# Patient Record
Sex: Female | Born: 1980 | Race: White | Hispanic: No | Marital: Married | Smoking: Never smoker
Health system: Southern US, Community
[De-identification: ages and names within clinical notes are randomized; demographics above are authoritative.]

## PROBLEM LIST (undated history)

## (undated) DIAGNOSIS — Z9889 Other specified postprocedural states: Secondary | ICD-10-CM

## (undated) DIAGNOSIS — D649 Anemia, unspecified: Secondary | ICD-10-CM

## (undated) DIAGNOSIS — F32A Depression, unspecified: Secondary | ICD-10-CM

## (undated) DIAGNOSIS — C801 Malignant (primary) neoplasm, unspecified: Secondary | ICD-10-CM

## (undated) DIAGNOSIS — I10 Essential (primary) hypertension: Secondary | ICD-10-CM

## (undated) DIAGNOSIS — M797 Fibromyalgia: Secondary | ICD-10-CM

## (undated) DIAGNOSIS — E282 Polycystic ovarian syndrome: Secondary | ICD-10-CM

## (undated) DIAGNOSIS — F329 Major depressive disorder, single episode, unspecified: Secondary | ICD-10-CM

## (undated) DIAGNOSIS — K219 Gastro-esophageal reflux disease without esophagitis: Secondary | ICD-10-CM

## (undated) DIAGNOSIS — J189 Pneumonia, unspecified organism: Secondary | ICD-10-CM

## (undated) DIAGNOSIS — F419 Anxiety disorder, unspecified: Secondary | ICD-10-CM

## (undated) DIAGNOSIS — R112 Nausea with vomiting, unspecified: Secondary | ICD-10-CM

## (undated) HISTORY — PX: BACK SURGERY: SHX140

## (undated) HISTORY — DX: Other specified postprocedural states: Z98.890

## (undated) HISTORY — PX: APPENDECTOMY: SHX54

## (undated) HISTORY — PX: TONSILLECTOMY: SUR1361

## (undated) HISTORY — DX: Nausea with vomiting, unspecified: R11.2

## (undated) HISTORY — DX: Polycystic ovarian syndrome: E28.2

## (undated) HISTORY — PX: OTHER SURGICAL HISTORY: SHX169

## (undated) HISTORY — PX: ABDOMINAL HYSTERECTOMY: SHX81

## (undated) HISTORY — PX: FRACTURE SURGERY: SHX138

---

## 2011-04-08 ENCOUNTER — Encounter (HOSPITAL_COMMUNITY): Payer: Self-pay | Admitting: Pharmacy Technician

## 2011-04-09 ENCOUNTER — Other Ambulatory Visit: Payer: Self-pay | Admitting: Orthopedic Surgery

## 2011-04-09 MED ORDER — TRANEXAMIC ACID 100 MG/ML IV SOLN: 1316.0000 mg | Freq: Once | INTRAVENOUS | Status: DC

## 2011-04-09 NOTE — H&P (Signed)
Vicki Yu is an 30 y.o. female.   Chief Complaint: Left knee pain  HPI: Pt states that she has had left knee pain for about 2.5 years. 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 Pt has had 4 previous surgeries on the left knee which have been ineffective at controlling her pain. One arthroscopy, 2 micro fractures and a osteochondral autograft transplantation (OATS) procedure. She still continues to have pain through all there surgeries and set up an appointment with Dr. Charlann Boxer. After discussions and explaining the risks, benefit and expectations of the procedure she wishes to proceed with a uni-lateral knee replacement of the medial aspect.  PMH:  PCOS GERD Seasonal Allergies  PSH: Appendectomy C-section Ankle surgery Left knee surgeries x 4  Social History:  does not have a smoking history on file. She does not have any smokeless tobacco history on file. Her alcohol and drug histories not on file.  Allergies:  Allergies  Allergen Reactions  . Penicillins Other (See Comments)    Pt does not remember    Medications Prior to Admission  Medication Sig Dispense Refill  . cetirizine (ZYRTEC) 10 MG tablet Take 10 mg by mouth daily.        . metFORMIN (GLUCOPHAGE) 850 MG tablet Take 850 mg by mouth 2 (two) times daily with a meal.        . naproxen sodium (ANAPROX) 220 MG tablet Take 220 mg by mouth 2 (two) times daily as needed. Pain         No current facility-administered medications on file as of 04/09/2011.    Review of Systems  Constitutional: Negative.   HENT: Negative.   Eyes: Negative.   Respiratory: Negative.   Cardiovascular: Negative.   Gastrointestinal: Negative.   Genitourinary: Negative.   Musculoskeletal: Positive for joint pain.  Skin: Negative.   Neurological: Negative.   Endo/Heme/Allergies: Negative.   Psychiatric/Behavioral: Negative.    Vitals: BP -124/84, HR - 90, Resp - 16, Ht - 64",  Wt: 87.7 kg  Physical Exam  Constitutional: She appears well-developed and well-nourished.  HENT:  Head: Normocephalic and atraumatic.  Eyes: Conjunctivae and EOM are normal. Pupils are equal, round, and reactive to light.  Neck: Normal range of motion. Neck supple. No JVD present. No tracheal deviation present. No thyromegaly present.  Cardiovascular: Normal rate, regular rhythm and normal heart sounds.   No murmur heard. Respiratory: Effort normal and breath sounds normal. No respiratory distress. She has no wheezes. She has no rales.  GI: Soft. She exhibits no distension. There is no tenderness.  Musculoskeletal: Normal range of motion. She exhibits tenderness.       Left knee: She exhibits normal range of motion, no swelling, no effusion and no ecchymosis. tenderness (tenderness over pre-patellar bursa, and medial aspect of the left knee.) found.     Assessment/Plan Left knee pain, status post injury 2.5 years ago and 4 surgeries.  Pt is scheduled for a unilateral knee replacement on the medial aspect per Dr. Charlann Boxer at [facility] on 04/20/2011. Risks, benefits and expectations were discussed with the patient. Patient understand the risks, benefits and expectations and wishes to proceed with surgery.  [name] Vicki Yu   PAC  04/09/2011, 6:28 PM

## 2011-04-13 ENCOUNTER — Encounter (HOSPITAL_COMMUNITY): Payer: Self-pay

## 2011-04-13 ENCOUNTER — Encounter (HOSPITAL_COMMUNITY): Payer: Worker's Compensation

## 2011-04-13 ENCOUNTER — Other Ambulatory Visit: Payer: Self-pay | Admitting: Orthopedic Surgery

## 2011-04-13 LAB — BASIC METABOLIC PANEL
CO2: 27 mEq/L (ref 19–32)
Calcium: 9.4 mg/dL (ref 8.4–10.5)
Creatinine, Ser: 0.82 mg/dL (ref 0.50–1.10)
Glucose, Bld: 82 mg/dL (ref 70–99)

## 2011-04-13 LAB — DIFFERENTIAL
Basophils Absolute: 0.1 10*3/uL (ref 0.0–0.1)
Basophils Relative: 1 % (ref 0–1)
Eosinophils Absolute: 0.3 10*3/uL (ref 0.0–0.7)
Eosinophils Relative: 4 % (ref 0–5)

## 2011-04-13 LAB — URINALYSIS, ROUTINE W REFLEX MICROSCOPIC
Bilirubin Urine: NEGATIVE
Hgb urine dipstick: NEGATIVE
Specific Gravity, Urine: 1.026 (ref 1.005–1.030)
pH: 5 (ref 5.0–8.0)

## 2011-04-13 LAB — SURGICAL PCR SCREEN
MRSA, PCR: NEGATIVE
Staphylococcus aureus: NEGATIVE

## 2011-04-13 LAB — CBC
MCH: 26.2 pg (ref 26.0–34.0)
MCHC: 32.8 g/dL (ref 30.0–36.0)
MCV: 79.7 fL (ref 78.0–100.0)
Platelets: 357 10*3/uL (ref 150–400)
RDW: 13 % (ref 11.5–15.5)
WBC: 6.9 10*3/uL (ref 4.0–10.5)

## 2011-04-13 LAB — PROTIME-INR: Prothrombin Time: 12.9 seconds (ref 11.6–15.2)

## 2011-04-13 LAB — APTT: aPTT: 32 seconds (ref 24–37)

## 2011-04-13 MED ORDER — CHLORHEXIDINE GLUCONATE 4 % EX LIQD: 60.0000 mL | Freq: Once | CUTANEOUS | Status: AC

## 2011-04-13 NOTE — Pre-Procedure Instructions (Signed)
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

## 2011-04-13 NOTE — Patient Instructions (Signed)
20 Vicki Yu  04/13/2011   Your procedure is scheduled on:  04/20/11  Surgery [phone]  Report to [facility] at 0530 AM.  Call this number if you have problems the morning of surgery: [phone]   Remember:   Do not eat food:After Midnight. Sunday night  Do not drink clear liquids: After Midnight.Sunday night  Take these medicines the morning of surgery with A SIP OF WATER: none   Do not wear jewelry, make-up or nail polish.  Do not wear lotions, powders, or perfumes. You may wear deodorant.  Do not shave 48 hours prior to surgery.  Do not bring valuables to the hospital.  Contacts, dentures or bridgework may not be worn into surgery.  Leave suitcase in the car. After surgery it may be brought to your room.  For patients admitted to the hospital, checkout time is 11:00 AM the day of discharge.   Patients discharged the day of surgery will not be allowed to drive home.  Name and phone number of your driver:husband  Special Instructions: CHG Shower Use Special Wash: 1/2 bottle night before surgery and 1/2 bottle morning of surgery.   Please read over the following fact sheets that you were given: Blood Transfusion Information

## 2011-04-14 ENCOUNTER — Other Ambulatory Visit: Payer: Self-pay | Admitting: Orthopedic Surgery

## 2011-04-20 ENCOUNTER — Encounter (HOSPITAL_COMMUNITY)
Admission: RE | Disposition: A | Discharge status: Home / Self Care | Payer: Self-pay | Source: Ambulatory Visit | Attending: Orthopedic Surgery

## 2011-04-20 ENCOUNTER — Encounter (HOSPITAL_COMMUNITY): Payer: Self-pay | Admitting: Certified Registered Nurse Anesthetist

## 2011-04-20 ENCOUNTER — Encounter (HOSPITAL_COMMUNITY): Payer: Self-pay | Admitting: *Deleted

## 2011-04-20 ENCOUNTER — Inpatient Hospital Stay (HOSPITAL_COMMUNITY): Payer: Worker's Compensation | Admitting: Certified Registered Nurse Anesthetist

## 2011-04-20 ENCOUNTER — Ambulatory Visit (HOSPITAL_COMMUNITY)
Admission: RE | Admit: 2011-04-20 | Discharge: 2011-04-23 | Disposition: A | Discharge status: Home / Self Care | Payer: Worker's Compensation | Source: Ambulatory Visit | Attending: Orthopedic Surgery | Admitting: Orthopedic Surgery

## 2011-04-20 DIAGNOSIS — M25569 Pain in unspecified knee: Secondary | ICD-10-CM | POA: Insufficient documentation

## 2011-04-20 DIAGNOSIS — K219 Gastro-esophageal reflux disease without esophagitis: Secondary | ICD-10-CM | POA: Insufficient documentation

## 2011-04-20 DIAGNOSIS — Z79899 Other long term (current) drug therapy: Secondary | ICD-10-CM | POA: Insufficient documentation

## 2011-04-20 DIAGNOSIS — Z01812 Encounter for preprocedural laboratory examination: Secondary | ICD-10-CM | POA: Insufficient documentation

## 2011-04-20 DIAGNOSIS — M1710 Unilateral primary osteoarthritis, unspecified knee: Principal | ICD-10-CM | POA: Insufficient documentation

## 2011-04-20 DIAGNOSIS — Z96659 Presence of unspecified artificial knee joint: Secondary | ICD-10-CM

## 2011-04-20 DIAGNOSIS — J309 Allergic rhinitis, unspecified: Secondary | ICD-10-CM | POA: Insufficient documentation

## 2011-04-20 DIAGNOSIS — E282 Polycystic ovarian syndrome: Secondary | ICD-10-CM | POA: Insufficient documentation

## 2011-04-20 HISTORY — PX: PARTIAL KNEE ARTHROPLASTY: SHX2174

## 2011-04-20 LAB — SYNOVIAL CELL COUNT + DIFF, W/ CRYSTALS

## 2011-04-20 SURGERY — ARTHROPLASTY, KNEE, UNICOMPARTMENTAL
Anesthesia: Spinal | Site: Knee | Laterality: Left | Wound class: Clean

## 2011-04-20 MED ORDER — CLINDAMYCIN PHOSPHATE 600 MG/50ML IV SOLN
INTRAVENOUS | Status: DC | PRN
  Administered 2011-04-20: 600 mg via INTRAVENOUS

## 2011-04-20 MED ORDER — KETOROLAC TROMETHAMINE 30 MG/ML IJ SOLN
INTRAMUSCULAR | Status: DC | PRN
  Administered 2011-04-20: 30 mg

## 2011-04-20 MED ORDER — DOCUSATE SODIUM 100 MG PO CAPS
100.0000 mg | ORAL_CAPSULE | Freq: Two times a day (BID) | ORAL | Status: DC
  Administered 2011-04-20 – 2011-04-23 (×7): 100 mg via ORAL
  Filled 2011-04-20 (×11): qty 1

## 2011-04-20 MED ORDER — DIPHENHYDRAMINE HCL 50 MG/ML IJ SOLN
INTRAMUSCULAR | Status: DC | PRN
  Administered 2011-04-20 (×2): 25 mg via INTRAVENOUS

## 2011-04-20 MED ORDER — CLINDAMYCIN PHOSPHATE 600 MG/50ML IV SOLN
600.0000 mg | Freq: Four times a day (QID) | INTRAVENOUS | Status: AC
  Administered 2011-04-20 – 2011-04-21 (×3): 600 mg via INTRAVENOUS
  Filled 2011-04-20 (×3): qty 50

## 2011-04-20 MED ORDER — ONDANSETRON HCL 4 MG/2ML IJ SOLN
INTRAMUSCULAR | Status: DC | PRN
  Administered 2011-04-20: 4 mg via INTRAVENOUS

## 2011-04-20 MED ORDER — HYDROMORPHONE HCL PF 2 MG/ML IJ SOLN
0.5000 mg | INTRAMUSCULAR | Status: DC | PRN
  Administered 2011-04-20 – 2011-04-21 (×5): 2 mg via INTRAVENOUS
  Administered 2011-04-21: 0.5 mg via INTRAVENOUS
  Administered 2011-04-21 – 2011-04-22 (×3): 2 mg via INTRAVENOUS
  Filled 2011-04-20 (×9): qty 1

## 2011-04-20 MED ORDER — ACETAMINOPHEN 325 MG PO TABS: 650.0000 mg | ORAL_TABLET | Freq: Four times a day (QID) | ORAL | Status: DC | PRN

## 2011-04-20 MED ORDER — DEXAMETHASONE SODIUM PHOSPHATE 10 MG/ML IJ SOLN
INTRAMUSCULAR | Status: DC | PRN
  Administered 2011-04-20: 10 mg via INTRAVENOUS

## 2011-04-20 MED ORDER — OMEPRAZOLE MAGNESIUM 20 MG PO TBEC: 20.0000 mg | DELAYED_RELEASE_TABLET | Freq: Once | ORAL | Status: DC

## 2011-04-20 MED ORDER — SODIUM CHLORIDE 0.9 % IV SOLN
INTRAVENOUS | Status: DC
  Administered 2011-04-20 – 2011-04-21 (×2): via INTRAVENOUS
  Filled 2011-04-20 (×11): qty 1000

## 2011-04-20 MED ORDER — METHOCARBAMOL 500 MG PO TABS
500.0000 mg | ORAL_TABLET | Freq: Four times a day (QID) | ORAL | Status: DC | PRN
  Administered 2011-04-20 – 2011-04-22 (×6): 500 mg via ORAL
  Filled 2011-04-20 (×6): qty 1

## 2011-04-20 MED ORDER — PROPOFOL 10 MG/ML IV EMUL
INTRAVENOUS | Status: DC | PRN
  Administered 2011-04-20: 75 ug/kg/min via INTRAVENOUS

## 2011-04-20 MED ORDER — SODIUM CHLORIDE 0.9 % IR SOLN
Status: DC | PRN
  Administered 2011-04-20: 1000 mL

## 2011-04-20 MED ORDER — MIDAZOLAM HCL 5 MG/5ML IJ SOLN
INTRAMUSCULAR | Status: DC | PRN
  Administered 2011-04-20 (×2): 0.5 mg via INTRAVENOUS
  Administered 2011-04-20: 1 mg via INTRAVENOUS
  Administered 2011-04-20: 2 mg via INTRAVENOUS

## 2011-04-20 MED ORDER — POLYETHYLENE GLYCOL 3350 17 G PO PACK
17.0000 g | PACK | Freq: Every day | ORAL | Status: DC | PRN
  Filled 2011-04-20: qty 1

## 2011-04-20 MED ORDER — PROMETHAZINE HCL 25 MG/ML IJ SOLN: 6.2500 mg | INTRAMUSCULAR | Status: DC | PRN

## 2011-04-20 MED ORDER — HYDROMORPHONE HCL PF 1 MG/ML IJ SOLN
INTRAMUSCULAR | Status: AC
  Filled 2011-04-20: qty 1

## 2011-04-20 MED ORDER — ACETAMINOPHEN 10 MG/ML IV SOLN
INTRAVENOUS | Status: DC | PRN
  Administered 2011-04-20: 1000 mg via INTRAVENOUS

## 2011-04-20 MED ORDER — LACTATED RINGERS IV SOLN
INTRAVENOUS | Status: DC | PRN
  Administered 2011-04-20 (×3): via INTRAVENOUS

## 2011-04-20 MED ORDER — BISACODYL 10 MG RE SUPP: 10.0000 mg | Freq: Every day | RECTAL | Status: DC | PRN

## 2011-04-20 MED ORDER — MENTHOL 3 MG MT LOZG: 1.0000 | LOZENGE | OROMUCOSAL | Status: DC | PRN

## 2011-04-20 MED ORDER — PANTOPRAZOLE SODIUM 40 MG PO TBEC
40.0000 mg | DELAYED_RELEASE_TABLET | Freq: Once | ORAL | Status: AC
  Administered 2011-04-20: 40 mg via ORAL
  Filled 2011-04-20: qty 1

## 2011-04-20 MED ORDER — METOCLOPRAMIDE HCL 5 MG/ML IJ SOLN
5.0000 mg | Freq: Three times a day (TID) | INTRAMUSCULAR | Status: DC | PRN
Start: 2011-04-20 — End: 2011-04-23

## 2011-04-20 MED ORDER — FLEET ENEMA 7-19 GM/118ML RE ENEM: 1.0000 | ENEMA | Freq: Every day | RECTAL | Status: DC | PRN

## 2011-04-20 MED ORDER — PHENOL 1.4 % MT LIQD: 1.0000 | OROMUCOSAL | Status: DC | PRN

## 2011-04-20 MED ORDER — EPHEDRINE SULFATE 50 MG/ML IJ SOLN
INTRAMUSCULAR | Status: DC | PRN
  Administered 2011-04-20: 10 mg via INTRAVENOUS

## 2011-04-20 MED ORDER — METHOCARBAMOL 100 MG/ML IJ SOLN
500.0000 mg | Freq: Four times a day (QID) | INTRAVENOUS | Status: DC | PRN
  Administered 2011-04-21: 500 mg via INTRAVENOUS
  Filled 2011-04-20: qty 5

## 2011-04-20 MED ORDER — CLINDAMYCIN PHOSPHATE 600 MG/50ML IV SOLN: 600.0000 mg | Freq: Once | INTRAVENOUS | Status: DC

## 2011-04-20 MED ORDER — BUPIVACAINE HCL 0.75 % IJ SOLN
INTRAMUSCULAR | Status: DC | PRN
  Administered 2011-04-20: 1.8 mL

## 2011-04-20 MED ORDER — ACETAMINOPHEN 500 MG PO TABS
1000.0000 mg | ORAL_TABLET | Freq: Three times a day (TID) | ORAL | Status: DC
  Administered 2011-04-20 – 2011-04-23 (×9): 1000 mg via ORAL
  Filled 2011-04-20 (×17): qty 2

## 2011-04-20 MED ORDER — ONDANSETRON HCL 4 MG/2ML IJ SOLN
4.0000 mg | Freq: Four times a day (QID) | INTRAMUSCULAR | Status: DC | PRN
  Administered 2011-04-22: 4 mg via INTRAVENOUS
  Filled 2011-04-20: qty 2

## 2011-04-20 MED ORDER — RIVAROXABAN 10 MG PO TABS
10.0000 mg | ORAL_TABLET | ORAL | Status: DC
  Administered 2011-04-21 – 2011-04-23 (×3): 10 mg via ORAL
  Filled 2011-04-20 (×4): qty 1

## 2011-04-20 MED ORDER — DIPHENHYDRAMINE HCL 25 MG PO CAPS
25.0000 mg | ORAL_CAPSULE | Freq: Four times a day (QID) | ORAL | Status: DC | PRN
  Administered 2011-04-20: 25 mg via ORAL
  Filled 2011-04-20: qty 1

## 2011-04-20 MED ORDER — HYDROMORPHONE HCL PF 1 MG/ML IJ SOLN: 0.2500 mg | INTRAMUSCULAR | Status: DC | PRN

## 2011-04-20 MED ORDER — BISACODYL 5 MG PO TBEC: 10.0000 mg | DELAYED_RELEASE_TABLET | Freq: Every day | ORAL | Status: DC | PRN

## 2011-04-20 MED ORDER — METFORMIN HCL 850 MG PO TABS
850.0000 mg | ORAL_TABLET | Freq: Two times a day (BID) | ORAL | Status: DC
  Administered 2011-04-20 – 2011-04-23 (×5): 850 mg via ORAL
  Filled 2011-04-20 (×9): qty 1

## 2011-04-20 MED ORDER — HYDROCODONE-ACETAMINOPHEN 7.5-325 MG PO TABS
1.0000 | ORAL_TABLET | ORAL | Status: DC
  Filled 2011-04-20: qty 1

## 2011-04-20 MED ORDER — ONDANSETRON HCL 4 MG PO TABS
4.0000 mg | ORAL_TABLET | Freq: Four times a day (QID) | ORAL | Status: DC | PRN
  Administered 2011-04-23: 4 mg via ORAL
  Filled 2011-04-20: qty 1

## 2011-04-20 MED ORDER — LORATADINE 10 MG PO TABS
10.0000 mg | ORAL_TABLET | Freq: Every day | ORAL | Status: DC
  Administered 2011-04-20 – 2011-04-23 (×4): 10 mg via ORAL
  Filled 2011-04-20 (×6): qty 1

## 2011-04-20 MED ORDER — SCOPOLAMINE 1 MG/3DAYS TD PT72
MEDICATED_PATCH | TRANSDERMAL | Status: DC | PRN
  Administered 2011-04-20: 1 via TRANSDERMAL

## 2011-04-20 MED ORDER — HYDROMORPHONE HCL PF 2 MG/ML IJ SOLN
0.5000 mg | INTRAMUSCULAR | Status: DC | PRN
  Administered 2011-04-20 (×2): 1 mg via INTRAVENOUS
  Filled 2011-04-20: qty 1

## 2011-04-20 MED ORDER — DIPHENHYDRAMINE HCL 50 MG/ML IJ SOLN
25.0000 mg | Freq: Four times a day (QID) | INTRAMUSCULAR | Status: DC | PRN
  Administered 2011-04-21: 25 mg via INTRAVENOUS
  Filled 2011-04-20: qty 1

## 2011-04-20 MED ORDER — MAGNESIUM HYDROXIDE 400 MG/5ML PO SUSP: 30.0000 mL | Freq: Two times a day (BID) | ORAL | Status: DC | PRN

## 2011-04-20 MED ORDER — FENTANYL CITRATE 0.05 MG/ML IJ SOLN
INTRAMUSCULAR | Status: DC | PRN
  Administered 2011-04-20: 100 ug via INTRAVENOUS

## 2011-04-20 MED ORDER — BUPIVACAINE-EPINEPHRINE 0.25% -1:200000 IJ SOLN
INTRAMUSCULAR | Status: DC | PRN
  Administered 2011-04-20: 30 mL

## 2011-04-20 MED ORDER — OXYCODONE HCL 5 MG PO TABS
5.0000 mg | ORAL_TABLET | ORAL | Status: DC | PRN
  Administered 2011-04-20 – 2011-04-21 (×3): 10 mg via ORAL
  Administered 2011-04-21 – 2011-04-22 (×3): 15 mg via ORAL
  Filled 2011-04-20: qty 2
  Filled 2011-04-20: qty 3
  Filled 2011-04-20 (×2): qty 2
  Filled 2011-04-20 (×2): qty 3

## 2011-04-20 MED ORDER — ACETAMINOPHEN 650 MG RE SUPP: 650.0000 mg | Freq: Four times a day (QID) | RECTAL | Status: DC | PRN

## 2011-04-20 MED ORDER — METOCLOPRAMIDE HCL 10 MG PO TABS: 5.0000 mg | ORAL_TABLET | Freq: Three times a day (TID) | ORAL | Status: DC | PRN

## 2011-04-20 SURGICAL SUPPLY — 45 items
BAG ZIPLOCK 12X15 (MISCELLANEOUS) ×2 IMPLANT
BANDAGE ELASTIC 6 VELCRO ST LF (GAUZE/BANDAGES/DRESSINGS) ×2 IMPLANT
BANDAGE ESMARK 6X9 LF (GAUZE/BANDAGES/DRESSINGS) ×1 IMPLANT
BLADE SAW RECIPROCATING 77.5 (BLADE) ×2 IMPLANT
BLADE SAW SGTL 13.0X1.19X90.0M (BLADE) ×2 IMPLANT
BNDG ESMARK 6X9 LF (GAUZE/BANDAGES/DRESSINGS) ×2
BOWL SMART MIX CTS (DISPOSABLE) ×4 IMPLANT
CEMENT HV SMART SET (Cement) ×4 IMPLANT
CLOTH BEACON ORANGE TIMEOUT ST (SAFETY) ×2 IMPLANT
COVER SURGICAL LIGHT HANDLE (MISCELLANEOUS) ×6 IMPLANT
CUFF TOURN SGL QUICK 34 (TOURNIQUET CUFF) ×1
CUFF TRNQT CYL 34X4X40X1 (TOURNIQUET CUFF) ×1 IMPLANT
DERMABOND ADVANCED (GAUZE/BANDAGES/DRESSINGS) ×1
DERMABOND ADVANCED .7 DNX12 (GAUZE/BANDAGES/DRESSINGS) ×1 IMPLANT
DRAPE EXTREMITY T 121X128X90 (DRAPE) ×2 IMPLANT
DRAPE POUCH INSTRU U-SHP 10X18 (DRAPES) ×2 IMPLANT
DRSG AQUACEL AG ADV 3.5X 6 (GAUZE/BANDAGES/DRESSINGS) ×2 IMPLANT
DRSG TEGADERM 4X4.75 (GAUZE/BANDAGES/DRESSINGS) ×2 IMPLANT
DURAPREP 26ML APPLICATOR (WOUND CARE) ×2 IMPLANT
ELECT REM PT RETURN 9FT ADLT (ELECTROSURGICAL) ×2
ELECTRODE REM PT RTRN 9FT ADLT (ELECTROSURGICAL) ×1 IMPLANT
EVACUATOR 1/8 PVC DRAIN (DRAIN) ×2 IMPLANT
FACESHIELD LNG OPTICON STERILE (SAFETY) ×8 IMPLANT
GAUZE SPONGE 2X2 8PLY STRL LF (GAUZE/BANDAGES/DRESSINGS) ×1 IMPLANT
GLOVE BIOGEL PI IND STRL 7.5 (GLOVE) ×1 IMPLANT
GLOVE BIOGEL PI IND STRL 8.5 (GLOVE) IMPLANT
GLOVE BIOGEL PI INDICATOR 7.5 (GLOVE) ×1
GLOVE BIOGEL PI INDICATOR 8.5 (GLOVE)
GLOVE ORTHO TXT STRL SZ7.5 (GLOVE) ×4 IMPLANT
GOWN STRL NON-REIN LRG LVL3 (GOWN DISPOSABLE) ×2 IMPLANT
KIT BASIN OR (CUSTOM PROCEDURE TRAY) ×2 IMPLANT
MANIFOLD NEPTUNE II (INSTRUMENTS) ×2 IMPLANT
NDL SAFETY ECLIPSE 18X1.5 (NEEDLE) ×1 IMPLANT
NEEDLE HYPO 18GX1.5 SHARP (NEEDLE) ×1
PACK TOTAL JOINT (CUSTOM PROCEDURE TRAY) ×2 IMPLANT
POSITIONER SURGICAL ARM (MISCELLANEOUS) ×2 IMPLANT
SPONGE GAUZE 2X2 STER 10/PKG (GAUZE/BANDAGES/DRESSINGS) ×1
SUCTION FRAZIER TIP 10 FR DISP (SUCTIONS) ×2 IMPLANT
SUT MNCRL AB 4-0 PS2 18 (SUTURE) ×2 IMPLANT
SUT VIC AB 1 CT1 36 (SUTURE) ×4 IMPLANT
SUT VIC AB 2-0 CT1 27 (SUTURE) ×2
SUT VIC AB 2-0 CT1 TAPERPNT 27 (SUTURE) ×2 IMPLANT
SYR 50ML LL SCALE MARK (SYRINGE) ×2 IMPLANT
TOWEL OR 17X26 10 PK STRL BLUE (TOWEL DISPOSABLE) ×4 IMPLANT
TRAY FOLEY CATH 14FRSI W/METER (CATHETERS) ×2 IMPLANT

## 2011-04-20 NOTE — Op Note (Signed)
NAMEDANAHI, Yu             ACCOUNT NO.:  [id number]  MEDICAL RECORD NO.:  [id number]  LOCATION:  [facility]                         FACILITY:  [facility]  PHYSICIAN:  Madlyn Frankel. Charlann Boxer, M.D.  DATE OF BIRTH:  1980-08-04  DATE OF PROCEDURE:  04/20/2011 DATE OF DISCHARGE:                              OPERATIVE REPORT   PREOPERATIVE DIAGNOSES: 1. Left knee medial compartment osteoarthritis. 2. Left knee patellofemoral osteoarthritis with failed multiple     surgical attempts with pain greater than 2 years.  POSTOPERATIVE DIAGNOSES: 1. Left knee medial compartment osteoarthritis. 2. Left knee patellofemoral osteoarthritis with failed multiple     surgical attempts with pain greater than 2 years.  PROCEDURE: 1. Left medial partial knee replacement utilizing Biomet Oxford knee     system with a small femur, a double A left medial tibia, and a size     3 insert. 2. Left knee patellofemoral replacement utilizing a Biomet, small     throat clear shield, and a 34 patella button.  SURGEON:  Madlyn Frankel. Charlann Boxer, MD  ASSISTANT:  Lanney Gins, PA-C  ANESTHESIA:  Spinal.  SPECIMENS:  I did send joint fluid and analyzed.  It had a slightly cloudy appearance.  Initial Gram stain was negative.  I also tested a urine dipstick and leukocyte esterase was negative after 2 minutes.  I did also send it for cell count, which was pending at the end of the case.  TOURNIQUET TIME:  100 minutes at 250 mmHg.  DRAINS:  One Hemovac.  COMPLICATIONS:  None apparent.  INDICATION FOR PROCEDURE:  Vicki Yu is a 30 year old female with a 2-year history of left knee injury and persistent recurring problems in her left knee.  She has had undergone multiple arthroscopic procedures in addition to osteochondral autograft type procedures.  She had presented to our office with persistent and recurrent pain in the left knee with recommendations from other surgeries for further surgical consideration.  We had a  lengthy discussion about her age, recurrent knee condition both medial and patellofemoral, and then after this discussion reviewing all options and continued observation which at this point failed to provide her satisfactory life quality versus repeat arthroscopic surgery versus total knee replacement versus partial knee replacements as we performed.  We decided to proceed in this fashion due to the potential minimalistic approach, preservation of the ligaments as well as addressing both the medial and patellofemoral compartments.  Risks of infection and DVT were discussed as well as the risk of failure of implant and need to proceed with total knee replacement.  She was ready to take this chance given her age and desired activity level.  PROCEDURE IN DETAIL:  Patient was brought to the operative theater. Once adequate anesthesia, preoperative antibiotics and Ancef administered, she was positioned supine with the left leg in a thigh tourniquet.  The left leg was positioned over the Oxford leg holder to allow for 120 degrees of flexion.  The left lower extremity was then prepped and draped in a sterile fashion.  A time-out was performed identifying the patient, planned procedure, and extremity.  The leg was then exsanguinated, tourniquet elevated to 250 mmHg.  The patient had a paramidline incision,  which was utilized and extended slightly proximal and distal to allow for exposure of the knee.  This midline incision was made followed by soft tissue exposure.  A median arthrotomy was then made.  We entered the knee, immediately we encountered an inflammatory  looking fluid that did not appear to be obviously purulent.  Nonetheless, I did at this point, send cultures for stat Gram stain culture.  In addition, I sent a syringe full to pathology for cell count to evaluate for infection.  In addition, we used urine dipstick test to assess for leukocyte esterase and this was negative at 2  minutes.  Given all these findings, we went ahead and proceeded with the case.  Following soft tissue exposure and debridement of the scar tissue above the knee and limited exposure on the proximal and medial aspect of the tibia, an initial retractor was placed.  Using extramedullary guide, parallel along the anterior cortex parallel to the tibial spine.  It was pinned in position using the appropriate guide for 4-mm cut.  The reciprocating saw was used along the notch followed by the oscillating saw.  The bone was removed and noted to have no significant tibial bone as a predominant problem on the medial femoral condyles.  At this point, the cut surface seemed to be best fit with double A tibial tray, the 3 steel gauge felt better with appropriate tension at this point.  At this point, attention was now directed to the femoral side.  The patient had advanced patellofemoral changes, particularly in the trochlea with a large chondral defect down to bone.  Previous harvest site from her transplant are located on the lateral aspect of the anterior lateral plane above the trochlea as well as the medial aspect of the femoral trochlea laterally.  The femoral canal was opened and drilled first in all and then intramedullary guide placed into the tibia and into the femur.  I then oriented the guide for the posterior cutting block and drill holes in relationship to the proximal tibial cut and the intramedullary guide. The holes were drilled into the medial femoral condyle in the central portion and a guide placed and the posterior cut made off the tip of the medial femur.  Following this resection, I used a 3 Spigot.  The 3 Spigot was in place, impacted, and the distal femur milled.  I removed the remaining bone around this area including the notch and then did a trial reduction. With the trial reduction, I felt it is a small femur and it  looked appropriately fit on the medial femoral  condyle.  This double A tibial tray sit nicely on the proximal tibia.  The 3-mm insert felt good in both 90 degrees of flexion and 20 degrees of flexion.  At this point, final preparation of the femur and tibia were carried out.  The 3 tibial tray was pinned into position and used a reciprocating saw to remove some of the bones from this area.  Then, with the osteotome removed the remaining bone from the keeled location.  The distal femur was then repaired with drill holes.  At this point, I placed a trial of small femur, keeled AA tibial tray, and reassessing the size 3 insert.  At this point, I attended to the patellofemoral replacement.  At this point, the patella was everted.  Soft tissue debridement carried out along the lateral aspect of the femur and measured the patella at 23 mm in depth and resected down to  about 14 and used a 34 patellar button.  Lug holes were drilled and I kept the trials in place to protect the patella during further preparation.  Per protocol, the femoral preparation was carried out with a starting drill.  I then tapped in the intramedullary guide to help set rotation.  The guide Was then pinned in place and the anterior flange cut made. This was flushed over the lateral cortex of the anterior femur without notching.  At this point, I chose to use a small femoral shield using a rasp we removed cartilage and bone down within the trochlear segment to allow the shield to fit appropriate into the distal femur anteriorly.  I went ahead and pinned it in position and did a trial with the patella.  At this point, I felt that the patella tracked through this area.  There was perhaps some infrapatellar scarring that is inhibiting a little bit of the tracking in deep flexion and subsequent released more of the infrapatellar and lateral soft tissues to allow for a smoother transition through the trochlea. There was no exposure through the lateral retinaculum.  Given  all these findings, the final components were opened.  The trial components were removed.  I drilled sclerotic bone in all compartments of the knee if necessary.  Following final debridements and manipulations of the bone and soft tissues for final component positioning, the cement was mixed.  I used one box of cement for the medial side and one for the patellofemoral component.  The cement was mixed for the first component.  The tibial tray was placed first, the kept cement removed, the knee was held at 45 degrees of flexion for a minute and a half and the trial stem was then placed.  After a minute and half, the final femoral component was placed and excessive cement was removed.  I placed a trial insert and had the next cement mixed for the femoral component.  The femoral component was then cemented in the above femur shield and cemented into the intramedullary canal.  The patella was then cemented in place and held with a patellar clamp.  The patellofemoral shield was held anterior with a downward pressure until the cement fully cured. Once the cement had fully cured, excessive cement was removed throughout the knee.  I retrialed and chose a 3 mm insert.  The 3 mm insert was snapped into the medial side of the knee.  The tourniquet had been let down after 100 minutes.  We re-irrigated the knee and injected the medial synovial capsule junction of the knee with 0.25% Marcaine and 1 cc of Toradol, a total of 31 cc.  The medium Hemovac drain was placed deep.  We re-irrigated the knee with normal saline solution.  At this point, I reapproximated extensor mechanism with the knee in flexion. The remaining wound was closed with 2-0 Vicryl and running 4-0 Monocryl. The knee was cleaned, dried, and dressed sterilely using Dermabond and Aquacel dressing.  The drain site was dressed separately.  She was then brought to the recovery room in stable condition, tolerating the procedure  well.     Madlyn Frankel Charlann Boxer, M.D.     MDO/MEDQ  D:  04/20/2011  T:  04/20/2011  Job:  [id number]

## 2011-04-20 NOTE — Anesthesia Procedure Notes (Signed)
Spinal  Start time: 04/20/2011 7:40 AM Staffing Anesthesiologist: Azell Der CRNA/Resident: [place], Amaziah Ghosh C Performed by: resident/CRNA  Spinal Block Patient position: sitting Prep: Betadine and site prepped and draped Patient monitoring: heart rate, continuous pulse ox and blood pressure Approach: midline Location: L3-4 Injection technique: single-shot Needle Needle type: Sprotte  Needle gauge: 24 G Assessment Sensory level: T4 Additional Notes Clear CSF before and after injection, No parasthesia

## 2011-04-20 NOTE — Interval H&P Note (Signed)
History and Physical Interval Note:   04/20/2011   7:33 AM   Vicki Yu  has presented today for surgery, with the diagnosis of left knee medial and patella femoral osteoarthritis  The various methods of treatment have been discussed with the patient and family. After consideration of risks, benefits and other options for treatment, the patient has consented to  Procedure(s): Left knee combine Patello-femoral replacement and medial UNICOMPARTMENTAL KNEE as a surgical intervention .  The patients' history has been reviewed, patient examined, no change in status, stable for surgery.  I have reviewed the patients' chart and labs.  Questions were answered to the patient's satisfaction.     Shelda Pal  MD

## 2011-04-20 NOTE — Transfer of Care (Signed)
Immediate Anesthesia Transfer of Care Note  Patient: Vicki Yu  Procedure(s) Performed:  UNICOMPARTMENTAL KNEE - Left unicompartmental knee arthroplasty/ Left Patella Femoral Replacement  Patient Location: PACU  Anesthesia Type: Spinal  Level of Consciousness: awake and alert   Airway & Oxygen Therapy: Patient Spontanous Breathing  Post-op Assessment: Report given to PACU RN  Post vital signs: Reviewed and stable  Complications: No apparent anesthesia complications

## 2011-04-20 NOTE — H&P (View-Only) (Signed)
Vicki Yu is an 30 y.o. female.   Chief Complaint: Left knee pain  HPI: Pt states that she has had left knee pain for about 2.5 years. 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 Pt has had 4 previous surgeries on the left knee which have been ineffective at controlling her pain. One arthroscopy, 2 micro fractures and a osteochondral autograft transplantation (OATS) procedure. She still continues to have pain through all there surgeries and set up an appointment with Dr. Olin. After discussions and explaining the risks, benefit and expectations of the procedure she wishes to proceed with a uni-lateral knee replacement of the medial aspect.  PMH:  PCOS GERD Seasonal Allergies  PSH: Appendectomy C-section Ankle surgery Left knee surgeries x 4  Social History:  does not have a smoking history on file. She does not have any smokeless tobacco history on file. Her alcohol and drug histories not on file.  Allergies:  Allergies  Allergen Reactions  . Penicillins Other (See Comments)    Pt does not remember    Medications Prior to Admission  Medication Sig Dispense Refill  . cetirizine (ZYRTEC) 10 MG tablet Take 10 mg by mouth daily.        . metFORMIN (GLUCOPHAGE) 850 MG tablet Take 850 mg by mouth 2 (two) times daily with a meal.        . naproxen sodium (ANAPROX) 220 MG tablet Take 220 mg by mouth 2 (two) times daily as needed. Pain         No current facility-administered medications on file as of 04/09/2011.    Review of Systems  Constitutional: Negative.   HENT: Negative.   Eyes: Negative.   Respiratory: Negative.   Cardiovascular: Negative.   Gastrointestinal: Negative.   Genitourinary: Negative.   Musculoskeletal: Positive for joint pain.  Skin: Negative.   Neurological: Negative.   Endo/Heme/Allergies: Negative.   Psychiatric/Behavioral: Negative.    Vitals: BP -124/84, HR - 90, Resp - 16, Ht - 64",  Wt: 87.7 kg  Physical Exam  Constitutional: She appears well-developed and well-nourished.  HENT:  Head: Normocephalic and atraumatic.  Eyes: Conjunctivae and EOM are normal. Pupils are equal, round, and reactive to light.  Neck: Normal range of motion. Neck supple. No JVD present. No tracheal deviation present. No thyromegaly present.  Cardiovascular: Normal rate, regular rhythm and normal heart sounds.   No murmur heard. Respiratory: Effort normal and breath sounds normal. No respiratory distress. She has no wheezes. She has no rales.  GI: Soft. She exhibits no distension. There is no tenderness.  Musculoskeletal: Normal range of motion. She exhibits tenderness.       Left knee: She exhibits normal range of motion, no swelling, no effusion and no ecchymosis. tenderness (tenderness over pre-patellar bursa, and medial aspect of the left knee.) found.     Assessment/Plan Left knee pain, status post injury 2.5 years ago and 4 surgeries.  Pt is scheduled for a unilateral knee replacement on the medial aspect per Dr. Olin at [facility] on 04/20/2011. Risks, benefits and expectations were discussed with the patient. Patient understand the risks, benefits and expectations and wishes to proceed with surgery.  Tammera Engert S. Hampton Cost   PAC  04/09/2011, 6:28 PM  

## 2011-04-20 NOTE — Anesthesia Postprocedure Evaluation (Signed)
  Anesthesia Post-op Note  Patient: Vicki Yu  Procedure(s) Performed:  UNICOMPARTMENTAL KNEE - Left unicompartmental knee arthroplasty/ Left Patella Femoral Replacement  Patient Location: PACU  Anesthesia Type: Spinal; moving both feet  Level of Consciousness: awake and alert   Airway and Oxygen Therapy: Patient Spontanous Breathing  Post-op Pain: mild  Post-op Assessment: Post-op Vital signs reviewed, Patient's Cardiovascular Status Stable, Respiratory Function Stable, Patent Airway and No signs of Nausea or vomiting  Post-op Vital Signs: stable  Complications: No apparent anesthesia complications

## 2011-04-20 NOTE — Anesthesia Preprocedure Evaluation (Addendum)
Anesthesia Evaluation  Patient identified by MRN, date of birth, ID band Patient awake    Reviewed: Allergy & Precautions, H&P , NPO status , Patient's Chart, lab work & pertinent test results  Airway       Dental   Pulmonary neg pulmonary ROS,          Cardiovascular Exercise Tolerance: Good     Neuro/Psych Negative Neurological ROS     GI/Hepatic   Endo/Other    Renal/GU    Polycystic ovary disease    Musculoskeletal   Abdominal   Peds  Hematology   Anesthesia Other Findings   Reproductive/Obstetrics                          Anesthesia Physical Anesthesia Plan  ASA: II  Anesthesia Plan: Spinal   Post-op Pain Management:    Induction:   Airway Management Planned: Simple Face Mask  Additional Equipment:   Intra-op Plan:   Post-operative Plan:   Informed Consent: I have reviewed the patients History and Physical, chart, labs and discussed the procedure including the risks, benefits and alternatives for the proposed anesthesia with the patient or authorized representative who has indicated his/her understanding and acceptance.     Plan Discussed with:   Anesthesia Plan Comments:         Anesthesia Quick Evaluation

## 2011-04-20 NOTE — Brief Op Note (Signed)
04/20/2011  10:36 AM  PATIENT:  Vicki Yu  30 y.o. female  PRE-OPERATIVE DIAGNOSIS:  left knee medial and patella femoral osteoarthritis  POST-OPERATIVE DIAGNOSIS:  left knee medial and patella femoral osteoarthritis  PROCEDURE:  Procedure(s): Left Knee medial compartment partial knee replacement and patello-femoral replacement UNICOMPARTMENTAL KNEE  SURGEON:  Surgeon(s): Shelda Pal  PHYSICIAN ASSISTANT: Lanney Gins, PA-C  ANESTHESIA:   spinal  EBL:  Total I/O In: 2000 [I.V.:2000] Out: 925 [Urine:850; Blood:75]  BLOOD ADMINISTERED:none  DRAINS: (1) Hemovact drain(s) in the left knee with  Suction Open   LOCAL MEDICATIONS USED:  BUPIVICAINE 30CC plus 1cc toradol  SPECIMEN:  Aspirate  DISPOSITION OF SPECIMEN:  micro  COUNTS:  YES  TOURNIQUET:   Total Tourniquet Time Documented: Thigh (Left) - 101 minutes  DICTATION: .Other Dictation: Dictation Number [id number]  PLAN OF CARE: Admit for overnight observation  PATIENT DISPOSITION:  PACU - hemodynamically stable.   Delay start of Pharmacological VTE agent (>24hrs) due to surgical blood loss or risk of bleeding:  {YES/NO/NOT APPLICABLE:20182

## 2011-04-21 LAB — CBC
HCT: 30.1 % — ABNORMAL LOW (ref 36.0–46.0)
Hemoglobin: 9.9 g/dL — ABNORMAL LOW (ref 12.0–15.0)
MCV: 80.1 fL (ref 78.0–100.0)
RBC: 3.76 MIL/uL — ABNORMAL LOW (ref 3.87–5.11)
RDW: 13.4 % (ref 11.5–15.5)
WBC: 13.5 10*3/uL — ABNORMAL HIGH (ref 4.0–10.5)

## 2011-04-21 LAB — BASIC METABOLIC PANEL
BUN: 10 mg/dL (ref 6–23)
CO2: 27 mEq/L (ref 19–32)
Chloride: 103 mEq/L (ref 96–112)
Creatinine, Ser: 1.02 mg/dL (ref 0.50–1.10)
GFR calc Af Amer: 84 mL/min — ABNORMAL LOW (ref >90)
Glucose, Bld: 109 mg/dL — ABNORMAL HIGH (ref 70–99)
Potassium: 4.1 mEq/L (ref 3.5–5.1)

## 2011-04-21 MED ORDER — DIPHENHYDRAMINE HCL 50 MG/ML IJ SOLN
25.0000 mg | Freq: Four times a day (QID) | INTRAMUSCULAR | Status: DC | PRN
  Administered 2011-04-21 (×2): 25 mg via INTRAVENOUS
  Administered 2011-04-21: 50 mg via INTRAVENOUS
  Filled 2011-04-21 (×3): qty 1

## 2011-04-21 MED ORDER — DIPHENHYDRAMINE HCL 25 MG PO CAPS: 25.0000 mg | ORAL_CAPSULE | Freq: Four times a day (QID) | ORAL | Status: DC | PRN

## 2011-04-21 NOTE — Progress Notes (Signed)
Occupational Therapy Evaluation Patient Details Name: Vicki Yu MRN: [id number] DOB: July 09, 1980 Today's Date: 04/21/2011  Problem List: There is no problem list on file for this patient.   Past Medical History:  Past Medical History  Diagnosis Date  . Polycystic ovary disease     LMP 04/03/11  states takes Metformin for this  . Hiatal hernia   . PONV (postoperative nausea and vomiting)     facial flushing with body itching with anesthesia- starts then with nausea   Past Surgical History:  Past Surgical History  Procedure Date  . Cesarean section, right  reconstructive surgeryof ankle, 3 arthroscopies left knee, 1 open knee surgery left   . Tonsillectomy   . Appendectomy   . Fracture surgery     OT Assessment/Plan/Recommendation OT Assessment Clinical Impression Statement: Pt will benefit from OT service in the acute care setting to increase I with ADLs and functional transfers in prep for d/c home with family. OT Recommendation/Assessment: Patient will need skilled OT in the acute care venue OT Problem List: Decreased knowledge of use of DME or AE;Pain OT Therapy Diagnosis : Acute pain OT Plan OT Frequency: Min 2X/week OT Treatment/Interventions: Self-care/ADL training;DME and/or AE instruction;Therapeutic activities;Patient/family education OT Recommendation Follow Up Recommendations: None Equipment Recommended: Toilet rise with handles (per pt request) Individuals Consulted Consulted and Agree with Results and Recommendations: Patient OT Goals Acute Rehab OT Goals OT Goal Formulation: With patient Time For Goal Achievement: 7 days ADL Goals Pt Will Perform Grooming: with modified independence;Standing at sink;Other (comment) (for 3 grooming tasks) ADL Goal: Grooming - Progress: Other (comment) Pt Will Transfer to Toilet: with modified independence;Ambulation;with DME;Raised toilet seat with arms ADL Goal: Toilet Transfer - Progress: Other (comment) Pt Will  Perform Tub/Shower Transfer: Shower transfer;with modified independence;Ambulation;with DME ADL Goal: Tub/Shower Transfer - Progress: Other (comment)  OT Evaluation Precautions/Restrictions  Restrictions Weight Bearing Restrictions: Yes LLE Weight Bearing: Weight bearing as tolerated Prior Functioning Home Living Lives With: Spouse Receives Help From: Family Type of Home: House Home Layout: One level Home Access: Stairs to enter Entrance Stairs-Rails: None Secretary/administrator of Steps: 5 Bathroom Shower/Tub: Health visitor: Standard Home Adaptive Equipment: None Additional Comments: dtr to assist with laundry; husband just had RCR Prior Function Level of Independence: Independent with gait;Needs assistance with gait Vocation: Full time employment 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 ADL ADL Eating/Feeding: Simulated;Independent Where Assessed - Eating/Feeding: Chair Grooming: Simulated;Independent Where Assessed - Grooming: Sitting, chair Upper Body Bathing: Simulated;Independent Where Assessed - Upper Body Bathing: Sitting, chair Lower Body Bathing: Simulated;Modified independent Where Assessed - Lower Body Bathing: Sit to stand from chair Upper Body Dressing: Simulated;Independent Where Assessed - Upper Body Dressing: Sitting, chair Lower Body Dressing: Simulated;Modified independent Where Assessed - Lower Body Dressing: Sit to stand from chair Toilet Transfer: Simulated;Minimal assistance Toilet Transfer Method: Stand pivot Toilet Transfer Equipment: Other (comment) (Simulated toilet transfer from chair to bed.) Toileting - Clothing Manipulation: Simulated;Modified independent Where Assessed - Toileting Clothing Manipulation: Standing Toileting - Hygiene: Simulated;Independent Where Assessed - Toileting Hygiene: Sit on 3-in-1 or toilet Tub/Shower Transfer: Not assessed Tub/Shower Transfer Method: Not assessed Equipment Used: Rolling  walker ADL Comments: Pt requiring increased time to complete functional transfers in prep for ADLs due to pain. Vision/Perception    Cognition Cognition Arousal/Alertness: Awake/alert Overall Cognitive Status: Appears within functional limits for tasks assessed Orientation Level: Oriented X4 Sensation/Coordination   Extremity Assessment RUE Assessment RUE Assessment: Within Functional Limits LUE Assessment LUE Assessment: Within Functional Limits Mobility  Bed Mobility Bed Mobility: Yes Sit to Supine - Left: 4: Min assist;HOB flat Sit to Supine - Left Details (indicate cue type and reason): Min assist to support L LE Transfers Transfers: Yes Sit to Stand: 4: Min assist;With armrests;From chair/3-in-1 Sit to Stand Details (indicate cue type and reason): VC for hand placement Stand to Sit: 4: Min assist;To bed Stand to Sit Details: VC for hand placement. Exercises   End of Session OT - End of Session Equipment Utilized During Treatment: Gait belt;Left knee immobilizer Activity Tolerance: Patient limited by pain Patient left: in bed;with call bell in reach;with family/visitor present General Behavior During Session: [facility] for tasks performed Cognition: [facility] for tasks performed   Cipriano Mile 04/21/2011, 11:03 AM  04/21/2011 Cipriano Mile OTR/L Pager [phone] Office [phone]

## 2011-04-21 NOTE — Progress Notes (Signed)
CM consult done. See CM notes in shadow chart.  Coner Gibbard Wyche RN BSN CCM [phone] 04/21/2011    

## 2011-04-21 NOTE — Progress Notes (Signed)
Physical Therapy Evaluation Patient Details Name: Vicki Yu MRN: [id number] DOB: 11-Jun-1980 Today's Date: 04/21/2011  Problem List: There is no problem list on file for this patient. [id number] eval 2  Past Medical History:  Past Medical History  Diagnosis Date  . Polycystic ovary disease     LMP 04/03/11  states takes Metformin for this  . Hiatal hernia   . PONV (postoperative nausea and vomiting)     facial flushing with body itching with anesthesia- starts then with nausea   Past Surgical History:  Past Surgical History  Procedure Date  . Cesarean section, right  reconstructive surgeryof ankle, 3 arthroscopies left knee, 1 open knee surgery left   . Tonsillectomy   . Appendectomy   . Fracture surgery     PT Assessment/Plan/Recommendation PT Assessment Clinical Impression Statement: pt will benefit from PT to maximize independence for home with family support PT Recommendation/Assessment: Patient will need skilled PT in the acute care venue PT Problem List: Decreased strength;Decreased range of motion;Decreased activity tolerance;Decreased mobility;Decreased knowledge of precautions;Decreased knowledge of use of DME Barriers to Discharge: None PT Therapy Diagnosis : Difficulty walking PT Plan PT Frequency: 7X/week PT Recommendation Follow Up Recommendations: Home health PT Equipment Recommended: Rolling walker with 5" wheels PT Goals  Acute Rehab PT Goals PT Goal Formulation: With patient Time For Goal Achievement: 5 days Pt will go Supine/Side to Sit: with supervision;with HOB 0 degrees PT Goal: Supine/Side to Sit - Progress: Progressing toward goal Pt will Transfer Sit to Stand/Stand to Sit: with supervision PT Transfer Goal: Sit to Stand/Stand to Sit - Progress: Progressing toward goal Pt will Ambulate: 51 - 150 feet;with supervision;with rolling walker PT Goal: Ambulate - Progress: Progressing toward goal Pt will Go Up / Down Stairs: 3-5 stairs;with min  assist;with least restrictive assistive device PT Goal: Up/Down Stairs - Progress: Other (comment) Pt will Perform Home Exercise Program: with supervision, verbal cues required/provided PT Goal: Perform Home Exercise Program - Progress: Other (comment)  PT Evaluation Precautions/Restrictions  Precautions Precautions: Knee Knee Immobilizer: On when out of bed or walking;Discontinue once straight leg raise with < 10 degree lag Restrictions Weight Bearing Restrictions: Yes LLE Weight Bearing: Weight bearing as tolerated Prior Functioning  Home Living Lives With: Spouse Receives Help From: Family Type of Home: House Home Layout: One level Home Access: Stairs to enter Entrance Stairs-Rails: None Entrance Stairs-Number of Steps: 5  Home Adaptive Equipment: None Additional Comments: dtr to assist with laundry; husband just had RCR Prior Function Level of Independence: Independent with gait, transfers  Cognition Cognition Arousal/Alertness: Awake/alert Overall Cognitive Status: Appears within functional limits for tasks assessed Orientation Level: Oriented X4    RLE Assessment RLE Assessment: Within Functional Limits LLE Assessment LLE Assessment:  (able to assist with SLR; ankle WFL)  Mobility (including Balance) Bed Mobility Supine to Sit: 4: Min assist;HOB flat Supine to Sit Details (indicate cue type and reason): min assist with LLE  Transfers Sit to Stand: 4: Min assist;From bed Sit to Stand Details (indicate cue type and reason): min/guard from bed; verbal cues for LLE position Stand to Sit: 4: Min assist;To chair/3-in-1 Stand to Sit Details: min/ guard for control; VCs for hands and LLE position Ambulation/Gait Ambulation/Gait Assistance: 4: Min assist Ambulation/Gait Assistance Details (indicate cue type and reason): verbal cues for swquence and RW position; limited by nausea Ambulation Distance (Feet): 50 Feet Assistive device: Rolling walker    Exercise    Total Joint Exercises Ankle Circles/Pumps: AROM;Both;10 reps Quad Sets: AROM;Both;5 reps  End of Session PT - End of Session Equipment Utilized During Treatment: Gait belt;Left knee immobilizer Activity Tolerance: Patient tolerated treatment well;Treatment limited secondary to medical complications (Comment) (nausea; RN made aware) Nurse Communication: Mobility status for transfers;Mobility status for ambulation;Weight bearing status General Behavior During Session: [facility] for tasks performed Cognition: [facility] for tasks performed  [facility] 04/21/2011, 11:29 AM

## 2011-04-21 NOTE — Progress Notes (Signed)
Patient ID: Vicki Yu, female   DOB: Aug 09, 1980, 30 y.o.   MRN: [id number] Subjective: 1 Day Post-Op Procedure(s) (LRB): UNICOMPARTMENTAL KNEE (Left)   Patient reports pain as moderate.  Objective:   VITALS:   Filed Vitals:   04/21/11 0500  BP: 94/63  Pulse: 68  Temp: 97.8 F (36.6 C)  Resp: 18    Neurovascular intact Dorsiflexion/Plantar flexion intact Incision: dressing C/D/I No cellulitis present Compartment soft  LABS  Basename 04/21/11 0432  HGB 9.9*  HCT 30.1*  WBC 13.5*  PLT 308     Basename 04/21/11 0432  NA 137  K 4.1  BUN 10  CREATININE 1.02  GLUCOSE 109*    Assessment/Plan: 1 Day Post-Op Procedure(s) (LRB): UNICOMPARTMENTAL KNEE (Left)  HV drain removed IV changed to saline lock later today Up with Physical Therapy WBAT Advance diet Plan for discharge tomorrow with HHPT   [name]. Vicki Yu   PAC  04/21/2011, 7:49 AM

## 2011-04-21 NOTE — Progress Notes (Signed)
Physical Therapy Treatment Patient Details Name: Vicki Yu MRN: [id number] DOB: 1981/02/29 Today's Date: 04/21/2011 [id number] 1gt   1te PT Assessment/Plan  PT - Assessment/Plan Comments on Treatment Session: pt should be ready for D/C Wed, will need to practice stairs tomorrow PT Plan: Discharge plan remains appropriate;Frequency remains appropriate PT Frequency: 7X/week Follow Up Recommendations: Home health PT Equipment Recommended: Rolling walker with 5" wheels PT Goals  Acute Rehab PT Goals PT Goal Formulation: With patient Time For Goal Achievement: 5 days Pt will go Supine/Side to Sit: with supervision;with HOB 0 degrees PT Goal: Supine/Side to Sit - Progress: Progressing toward goal Pt will Transfer Sit to Stand/Stand to Sit: with supervision PT Transfer Goal: Sit to Stand/Stand to Sit - Progress: Progressing toward goal Pt will Ambulate: 51 - 150 feet;with supervision;with rolling walker PT Goal: Ambulate - Progress: Progressing toward goal Pt will Go Up / Down Stairs: 3-5 stairs;with min assist;with least restrictive assistive device PT Goal: Up/Down Stairs - Progress: Other (comment) Pt will Perform Home Exercise Program: with supervision, verbal cues required/provided PT Goal: Perform Home Exercise Program - Progress: Progressing toward goal  PT Treatment Precautions/Restrictions  Precautions Precautions: Knee Knee Immobilizer: On when out of bed or walking;Discontinue once straight leg raise with < 10 degree lag Restrictions Weight Bearing Restrictions: Yes LLE Weight Bearing: Weight bearing as tolerated Mobility (including Balance) Bed Mobility Supine to Sit: 4: Min assist;HOB flat Supine to Sit Details (indicate cue type and reason): min assist with LLE Sit to Supine - Left: 4: Min assist;HOB flat Sit to Supine - Left Details (indicate cue type and reason): min assist with Left LE Transfers Sit to Stand: 4: Min assist;With armrests;From chair/3-in-1 Sit  to Stand Details (indicate cue type and reason): verbal cues for hand and LLE position Stand to Sit: 4: Min assist;To chair/3-in-1 Stand to Sit Details: verbal cues for LLE position and hands Ambulation/Gait Ambulation/Gait Assistance: 4: Min assist Ambulation/Gait Assistance Details (indicate cue type and reason): verbal cues for sequence, step length and RW distance from self Ambulation Distance (Feet): 60 Feet Assistive device: Rolling walker    Exercise  Total Joint Exercises Ankle Circles/Pumps: AROM;Both;10 reps Quad Sets: AROM;10 reps;Left;Supine Heel Slides: AAROM;Left;10 reps;Supine Straight Leg Raises: AAROM;10 reps;Left;Supine End of Session PT - End of Session Equipment Utilized During Treatment: Gait belt;Left knee immobilizer Activity Tolerance: Patient limited by pain Patient left: in bed;with call bell in reach;with family/visitor present Nurse Communication: Mobility status for transfers;Mobility status for ambulation;Weight bearing status General Behavior During Session: [facility] for tasks performed Cognition: [facility] for tasks performed  [facility] 04/21/2011, 2:07 PM

## 2011-04-22 ENCOUNTER — Encounter (HOSPITAL_COMMUNITY): Payer: Self-pay | Admitting: Orthopedic Surgery

## 2011-04-22 MED ORDER — PROMETHAZINE HCL 25 MG/ML IJ SOLN
12.5000 mg | Freq: Four times a day (QID) | INTRAMUSCULAR | Status: DC | PRN
  Administered 2011-04-22 (×2): 12.5 mg via INTRAVENOUS
  Filled 2011-04-22 (×2): qty 1

## 2011-04-22 MED ORDER — KETOROLAC TROMETHAMINE 15 MG/ML IJ SOLN
15.0000 mg | Freq: Once | INTRAMUSCULAR | Status: AC
  Administered 2011-04-22: 15 mg via INTRAVENOUS

## 2011-04-22 MED ORDER — KETOROLAC TROMETHAMINE 15 MG/ML IJ SOLN
7.5000 mg | Freq: Four times a day (QID) | INTRAMUSCULAR | Status: DC
  Administered 2011-04-22: 15 mg via INTRAVENOUS
  Administered 2011-04-22 – 2011-04-23 (×2): 7.5 mg via INTRAVENOUS
  Filled 2011-04-22 (×7): qty 1

## 2011-04-22 MED ORDER — KETOROLAC TROMETHAMINE 15 MG/ML IJ SOLN
INTRAMUSCULAR | Status: AC
  Administered 2011-04-23: 7.5 mg via INTRAVENOUS
  Filled 2011-04-22: qty 1

## 2011-04-22 MED ORDER — OXYCODONE HCL 5 MG PO TABS
10.0000 mg | ORAL_TABLET | ORAL | Status: DC | PRN
  Administered 2011-04-22: 10 mg via ORAL
  Administered 2011-04-22: 20 mg via ORAL
  Administered 2011-04-22 (×2): 10 mg via ORAL
  Administered 2011-04-23 (×3): 20 mg via ORAL
  Filled 2011-04-22 (×3): qty 2
  Filled 2011-04-22 (×4): qty 4

## 2011-04-22 MED ORDER — SODIUM CHLORIDE 0.9 % IV SOLN
INTRAVENOUS | Status: DC
  Administered 2011-04-22 (×2): via INTRAVENOUS

## 2011-04-22 NOTE — Progress Notes (Signed)
PT/OT/ST Cancellation Note  ___Treatment cancelled today due to medical issues with patient which prohibited therapy  ___ Treatment cancelled today due to patient receiving procedure or test   ___ Treatment cancelled today due to patient's refusal to participate   _x__ Treatment cancelled at this time due to severe pain. Patient waiting for pain medication to arrive from pharmacy per RN. Will follow today.     Signature: [name] PT 04/22/2011  [phone]  9:03

## 2011-04-22 NOTE — Progress Notes (Signed)
CM received referral.  See [facility] note in shadow chart. 

## 2011-04-22 NOTE — Progress Notes (Signed)
Physical Therapy Treatment Patient Details Name: Vicki Yu MRN: [id number] DOB: 08-14-1980 Today's Date: 04/22/2011  PT Assessment/Plan  PT - Assessment/Plan Comments on Treatment Session: Pain limiting activity tolerance. Now planning for DC tomorrow. PT Plan: Discharge plan remains appropriate;Frequency remains appropriate PT Frequency: 7X/week Follow Up Recommendations: Home health PT Equipment Recommended: 3 in 1 bedside comode;Other (comment) PT Goals  Acute Rehab PT Goals PT Goal Formulation: With patient Time For Goal Achievement: 5 days Pt will Transfer Sit to Stand/Stand to Sit: with supervision PT Transfer Goal: Sit to Stand/Stand to Sit - Progress: Met Pt will Ambulate: 51 - 150 feet;with supervision;with rolling walker PT Goal: Ambulate - Progress: Progressing toward goal Pt will Perform Home Exercise Program: with supervision, verbal cues required/provided PT Goal: Perform Home Exercise Program - Progress: Progressing toward goal  PT Treatment Precautions/Restrictions  Precautions Precautions: Knee Knee Immobilizer: On when out of bed or walking Restrictions Weight Bearing Restrictions: Yes LLE Weight Bearing: Weight bearing as tolerated Mobility (including Balance) Bed Mobility Bed Mobility: No Supine to Sit: 4: Min assist Transfers Transfers: Yes Sit to Stand: 6: Modified independent (Device/Increase time);From chair/3-in-1;With upper extremity assist Stand to Sit: 6: Modified independent (Device/Increase time);With upper extremity assist;To chair/3-in-1 Stand to Sit Details: VCs for hand placement Ambulation/Gait Ambulation/Gait: Yes Ambulation/Gait Assistance: 6: Modified independent (Device/Increase time) Ambulation/Gait Assistance Details (indicate cue type and reason): VCs for positioning in RW when backing up to chair Ambulation Distance (Feet): 140 Feet Assistive device: Standard walker Gait Pattern: Step-to pattern;Decreased weight shift to  left Stairs: No    Exercise  Total Joint Exercises Ankle Circles/Pumps: AROM;Both;10 reps Quad Sets: Left;5 reps;Seated Short Arc Quad: AAROM;Left;10 reps;Seated Heel Slides: AAROM;Left;10 reps;Supine Straight Leg Raises: AAROM;10 reps;Left;Supine End of Session PT - End of Session Equipment Utilized During Treatment: Gait belt;Left knee immobilizer Activity Tolerance: Patient limited by pain Patient left: in chair;with call bell in reach;with family/visitor present General Behavior During Session: [facility] for tasks performed Cognition: [facility] for tasks performed  [name] 04/22/2011, 2:11 PM

## 2011-04-22 NOTE — Progress Notes (Signed)
Patient ID: Vicki Yu, female   DOB: Nov 28, 1980, 30 y.o.   MRN: [id number]  Subjective: 2 Days Post-Op Procedure(s) (LRB): UNICOMPARTMENTAL KNEE (Left)   Patient reports pain as severe. Pt is complaining of nausea and vomitting.  Objective:   VITALS:   Filed Vitals:   04/22/11 0530  BP: 122/78  Pulse: 111  Temp: 98.3 F (36.8 C)  Resp: 18    Neurovascular intact Dorsiflexion/Plantar flexion intact Incision: dressing C/D/I No cellulitis present Compartment soft  LABS  Basename 04/21/11 0432  HGB 9.9*  HCT 30.1*  WBC 13.5*  PLT 308     Basename 04/21/11 0432  NA 137  K 4.1  BUN 10  CREATININE 1.02  GLUCOSE 109*    Assessment/Plan: 2 Days Post-Op Procedure(s) (LRB): UNICOMPARTMENTAL KNEE (Left)   Pt ordered Phenergan, increased dose of Oxycodone, ordered Toradol Pt's IV restarted for hydration with N&V Advance diet Up with therapy Discharge home with home health possibly Thursday   [name]. Grissel Tyrell   PAC  04/22/2011, 8:49 AM

## 2011-04-22 NOTE — Progress Notes (Addendum)
Occupational Therapy Treatment Patient Details Name: Vicki Yu MRN: [id number] DOB: 10-18-1980 Today's Date: 04/22/2011 1118 1135 OT Assessment/Plan OT Assessment/Plan Comments on Treatment Session: pt near goals:  31 year old daughter can assist wtih sock.  Will not need follow up OT:  goals almost met, but supervision given for safety with ambulation.  Signing off in acute OT Plan: Discharge plan remains appropriate Follow Up Recommendations: None Equipment Recommended: 3 in 1 bedside comode;Other (comment) (delivered) OT Goals    OT Treatment Precautions/Restrictions  Precautions Precautions: Knee Knee Immobilizer: On when out of bed or walking Restrictions Weight Bearing Restrictions: Yes LLE Weight Bearing: Weight bearing as tolerated   ADL ADL Grooming: Modified independent;Other (comment) (walker at sink; supervision to ambulate there) Where Assessed - Grooming: Standing at sink Toilet Transfer: Performed;Supervision/safety Toilet Transfer Method: Proofreader: Comfort height toilet;Other (comment) (3:1 used next to it to provide arm surface with grab bar) Toileting - Clothing Manipulation: Performed;Modified independent Where Assessed - Toileting Clothing Manipulation: Standing Toileting - Hygiene: Performed;Modified independent Where Assessed - Toileting Hygiene: Sit to stand from 3-in-1 or toilet Tub/Shower Transfer: Performed;Supervision/safety Tub/Shower Transfer Method: Science writer: Shower seat with back;Walk in shower Equipment Used: Rolling walker Mobility  Bed Mobility Bed Mobility: Yes Supine to Sit: 4: Min assist Transfers Sit to Stand: 5: Supervision Stand to Sit: 5: Supervision;To chair/3-in-1 Stand to Sit Details: VCs for hand placement Exercises Total Joint Exercises Ankle Circles/Pumps: AROM;Both;10 reps Quad Sets: Left;5 reps;Seated  End of Session OT - End of Session Activity  Tolerance: Patient limited by pain Patient left: Other (comment) (PT overlapped session; handed off to her) General Behavior During Session: [facility] for tasks performed Cognition: [facility] for tasks performed  Enora Trillo  [phone] 04/22/2011, 12:07 PM

## 2011-04-22 NOTE — Progress Notes (Signed)
Physical Therapy Treatment Patient Details Name: Vicki Yu MRN: [id number] DOB: 03/18/1981 Today's Date: 04/22/2011 11:30-11:45 G   PT Assessment/Plan  PT - Assessment/Plan Comments on Treatment Session: Pain limiting activity tolerance. Now planning for DC tomorrow. PT Plan: Discharge plan remains appropriate;Frequency remains appropriate PT Frequency: 7X/week Follow Up Recommendations: Home health PT Equipment Recommended: Rolling walker with 5" wheels PT Goals  Acute Rehab PT Goals PT Goal Formulation: With patient Time For Goal Achievement: 5 days Pt will Transfer Sit to Stand/Stand to Sit: with supervision PT Transfer Goal: Sit to Stand/Stand to Sit - Progress: Met Pt will Ambulate: 51 - 150 feet;with supervision;with rolling walker PT Goal: Ambulate - Progress: Progressing toward goal  PT Treatment Precautions/Restrictions  Precautions Precautions: Knee Knee Immobilizer: On when out of bed or walking;Discontinue once straight leg raise with < 10 degree lag Restrictions Weight Bearing Restrictions: Yes LLE Weight Bearing: Weight bearing as tolerated Mobility (including Balance) Transfers Stand to Sit: 5: Supervision;To chair/3-in-1 Stand to Sit Details: VCs for hand placement Ambulation/Gait Ambulation/Gait: Yes Ambulation/Gait Assistance: 5: Supervision Ambulation/Gait Assistance Details (indicate cue type and reason): VCs for positioning in RW when backing up to chair Ambulation Distance (Feet): 60 Feet (limited by 8/10 pain, meds requested) Assistive device: Rolling walker Gait Pattern: Step-to pattern;Decreased weight shift to left Stairs: No    Exercise  Total Joint Exercises Ankle Circles/Pumps: AROM;Both;10 reps Quad Sets: Left;5 reps;Seated End of Session PT - End of Session Equipment Utilized During Treatment: Gait belt;Left knee immobilizer Activity Tolerance: Patient limited by pain Patient left: in chair;with call bell in  reach General Behavior During Session: [facility] for tasks performed Cognition: [facility] for tasks performed  Vicki Yu 04/22/2011, 11:55 AM [phone]

## 2011-04-23 LAB — BODY FLUID CULTURE: Culture: NO GROWTH

## 2011-04-23 MED ORDER — ACETAMINOPHEN 500 MG PO TABS: 1000.0000 mg | ORAL_TABLET | Freq: Three times a day (TID) | ORAL | Status: AC

## 2011-04-23 MED ORDER — OXYCODONE HCL 10 MG PO TABS: 10.0000 mg | ORAL_TABLET | ORAL | Status: AC | PRN

## 2011-04-23 MED ORDER — PROMETHAZINE HCL 12.5 MG PO TABS: 12.5000 mg | ORAL_TABLET | Freq: Four times a day (QID) | ORAL | Status: AC | PRN

## 2011-04-23 MED ORDER — ASPIRIN EC 325 MG PO TBEC: 325.0000 mg | DELAYED_RELEASE_TABLET | Freq: Two times a day (BID) | ORAL | Status: AC

## 2011-04-23 MED ORDER — DSS 100 MG PO CAPS: 100.0000 mg | ORAL_CAPSULE | Freq: Two times a day (BID) | ORAL | Status: AC

## 2011-04-23 MED ORDER — POLYETHYLENE GLYCOL 3350 17 G PO PACK: 17.0000 g | PACK | Freq: Two times a day (BID) | ORAL | Status: AC

## 2011-04-23 MED ORDER — METHOCARBAMOL 500 MG PO TABS: 500.0000 mg | ORAL_TABLET | Freq: Four times a day (QID) | ORAL | Status: AC | PRN

## 2011-04-23 MED ORDER — DIPHENHYDRAMINE HCL 25 MG PO CAPS: 25.0000 mg | ORAL_CAPSULE | Freq: Four times a day (QID) | ORAL | Status: AC | PRN

## 2011-04-23 MED ORDER — RIVAROXABAN 10 MG PO TABS: 10.0000 mg | ORAL_TABLET | ORAL | Status: DC

## 2011-04-23 NOTE — Discharge Summary (Signed)
Physician Discharge Summary  Patient ID: Vicki Yu MRN: [id number] DOB/AGE: April 13, 1981 30 y.o.  Admit date: 04/20/2011 Discharge date: 04/23/2011  Procedures:  Procedure(s) (LRB): UNICOMPARTMENTAL KNEE (Left)  Attending Physician: Shelda Pal   Admission Diagnoses: Left knee pain / OA  Discharge Diagnoses:  1. S/P Left Unicompartmental knee replacement 2. PCOS  3. GERD  4. Seasonal Allergies  HPI: Pt states that she has had left knee pain for about 2.5 years. 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 Pt has had 4 previous surgeries on the left knee which have been ineffective at controlling her pain. One arthroscopy, 2 micro fractures and a osteochondral autograft transplantation (OATS) procedure. She still continues to have pain through all there surgeries and set up an appointment with Dr. Charlann Boxer. After discussions and explaining the risks, benefit and expectations of the procedure she wishes to proceed with a uni-lateral knee replacement of the medial aspect.  PCP: No primary provider on file.   Discharged Condition: good  Hospital Course:  Patient underwent the above stated procedure on 04/20/2011. Patient tolerated the procedure well and brought to the recovery room in good condition and subsequently to the floor.  POD #1 BP: 94/63 ; Pulse: 68 ; Temp: 97.8 F  Pt's foley was removed, as well as the hemovac drain removed. IV was changed to a saline lock. Neurovascular intact, dorsiflexion/Plantar flexion intact, incision: dressing C/D/I, no cellulitis present and compartment soft.  LABS  Basename  04/21/11 0432   HGB  9.9*   HCT  30.1*    POD #2  BP: 122/78 ; Pulse: 111 ; Temp: 98.3 F Neurovascular intact, dorsiflexion/Plantar flexion intact, incision: dressing C/D/I, no cellulitis present and compartment soft. Complaining of lots nausea and pain. Medicines changed.  POD #3  BP: 103/73 ; Pulse: 106  ; Temp: 98.7 F Neurovascular intact, dorsiflexion/Plantar flexion intact, incision: dressing C/D/I, no cellulitis present and compartment soft. Pain is under control with medicines. Pt she was doing well enough to be discharged home with home health PT.   Discharge Exam: Extremities: Homans sign is negative, no sign of DVT, no edema, redness or tenderness in the calves or thighs and no ulcers, gangrene or trophic changes  Disposition: Discharge home with Home Health PT Maintain surgical dressing for 8 days, then replace with gauze and tape. Keep the area dry and clean until follow up. Follow up in 2 weeks at [facility]. Call with any questions or concerns.   Discharge Orders    Future Orders Please Complete By Expires   Call MD / Call 911      Comments:   If you experience chest pain or shortness of breath, CALL 911 and be transported to the hospital emergency room.  If you develope a fever above 101 F, pus (white drainage) or increased drainage or redness at the wound, or calf pain, call your surgeon's office.   Constipation Prevention      Comments:   Drink plenty of fluids.  Prune juice may be helpful.  You may use a stool softener, such as Colace (over the counter) 100 mg twice a day.  Use MiraLax (over the counter) for constipation as needed.   Increase activity slowly as tolerated      Weight Bearing as taught in Physical Therapy      Comments:   Weight bearing as tolerated. Use a walker or crutches as instructed.   Discharge instructions  Comments:   Maintain surgical dressing for 8 days, then replace with gauze and tape. Keep the area dry and clean until follow up. Follow up in 2 weeks at [facility]. Call with any questions or concerns.     Driving restrictions      Comments:   No driving for 4 weeks   TED hose      Comments:   Use stockings (TED hose) for 2 weeks on both leg(s).  You may remove them at night for sleeping.   Change dressing        Comments:   Maintain surgical dressing for 8 days, then replace with gauze and tape. Keep the area dry and clean until follow up in 2 weeks.      Current Discharge Medication List    START taking these medications   Details  acetaminophen (TYLENOL) 500 MG tablet Take 2 tablets (1,000 mg total) by mouth every 8 (eight) hours. Qty: 30 tablet    aspirin EC 325 MG tablet Take 1 tablet (325 mg total) by mouth 2 (two) times daily. Refills: 0    diphenhydrAMINE (BENADRYL) 25 mg capsule Take 1-2 capsules (25-50 mg total) by mouth every 6 (six) hours as needed for itching.    docusate sodium 100 MG CAPS Take 100 mg by mouth 2 (two) times daily.    methocarbamol (ROBAXIN) 500 MG tablet Take 1 tablet (500 mg total) by mouth every 6 (six) hours as needed.    oxyCODONE 10 MG TABS Take 1-2 tablets (10-20 mg total) by mouth every 4 (four) hours as needed for pain. Qty: 100 tablet, Refills: 0    polyethylene glycol (MIRALAX / GLYCOLAX) packet Take 17 g by mouth 2 (two) times daily. Qty: 14 each, Refills: 0    promethazine (PHENERGAN) 12.5 MG tablet Take 1 tablet (12.5 mg total) by mouth every 6 (six) hours as needed for nausea. Qty: 30 tablet, Refills: 0    rivaroxaban (XARELTO) 10 MG TABS tablet Take 1 tablet (10 mg total) by mouth daily. Qty: 12 tablet      CONTINUE these medications which have NOT CHANGED   Details  metFORMIN (GLUCOPHAGE) 850 MG tablet Take 850 mg by mouth 2 (two) times daily with a meal.     omeprazole (PRILOSEC OTC) 20 MG tablet Take 20 mg by mouth once.      cetirizine (ZYRTEC) 10 MG tablet Take 10 mg by mouth daily.       STOP taking these medications     naproxen sodium (ANAPROX) 220 MG tablet          Signed: Anastasio Yu. Vicki Yu   PAC  04/23/2011, 8:29 AM

## 2011-04-23 NOTE — Progress Notes (Signed)
Patient given discharge instructions and prescription. Iv site removed, catheter tip intact. Pain medicine given for 8/10 pain. Patient left unit in wheelchair accompanied by family and staff member.

## 2011-04-23 NOTE — Progress Notes (Signed)
Subjective: 3 Days Post-Op Procedure(s) (LRB): UNICOMPARTMENTAL KNEE (Left)   Patient reports pain as mild.  Objective:   VITALS:   Filed Vitals:   04/23/11 0700  BP: 103/73  Pulse: 106  Temp: 98.7 F (37.1 C)  Resp: 18   No new labs  Neurovascular intact Dorsiflexion/Plantar flexion intact Incision: dressing C/D/I No cellulitis present Compartment soft    Assessment/Plan: 3 Days Post-Op Procedure(s) (LRB): UNICOMPARTMENTAL KNEE (Left)   Up with therapy Discharge home with home health today after PT x1   Vicki Yu. Vicki Yu   PAC  04/23/2011, 8:17 AM

## 2011-04-23 NOTE — Progress Notes (Signed)
04/23/2011  .... 0015... Pt continues to report severe pain in left knee, described as throbbing and tightness (see pain assessments); minimal relief with prn Oxycodone IR, no relief with Robaxin; LLE color WNL, denies numbness, pulses 2+... notified Lafonda Mosses, [place] on-call, who gave approval to take down ACE wrap to visualize knee and then call her back.... RN Marvia Pickles

## 2011-04-23 NOTE — Progress Notes (Signed)
04/23/2011 ... 12:41 AM ..Marland Kitchen Pt reports pain is now at goal (3) after having received Dilaudid 2 mg IV (see MAR)... Knee incision drsg is C/D/I; knee is swollen and warm to touch... Notified Lafonda Mosses, [place] on-call... Received instruction to replace ACE wrap and continue current pain med regimen... Emotional support and education provided to pt... RN Marvia Pickles

## 2011-04-25 LAB — ANAEROBIC CULTURE

## 2011-06-02 LAB — AFB CULTURE WITH SMEAR (NOT AT ARMC): Acid Fast Smear: NONE SEEN

## 2012-05-13 ENCOUNTER — Ambulatory Visit (INDEPENDENT_AMBULATORY_CARE_PROVIDER_SITE_OTHER): Payer: BC Managed Care – PPO

## 2012-05-13 ENCOUNTER — Other Ambulatory Visit: Payer: Self-pay | Admitting: Orthopedic Surgery

## 2012-05-13 DIAGNOSIS — M25569 Pain in unspecified knee: Secondary | ICD-10-CM

## 2012-05-13 DIAGNOSIS — Z9889 Other specified postprocedural states: Secondary | ICD-10-CM

## 2012-05-13 DIAGNOSIS — R52 Pain, unspecified: Secondary | ICD-10-CM

## 2017-01-29 ENCOUNTER — Other Ambulatory Visit: Payer: Self-pay | Admitting: Orthopedic Surgery

## 2017-01-29 DIAGNOSIS — M25562 Pain in left knee: Secondary | ICD-10-CM

## 2017-01-29 DIAGNOSIS — M25561 Pain in right knee: Secondary | ICD-10-CM

## 2017-02-02 ENCOUNTER — Other Ambulatory Visit: Payer: Self-pay | Admitting: Orthopedic Surgery

## 2017-02-07 ENCOUNTER — Ambulatory Visit
Admission: RE | Admit: 2017-02-07 | Discharge: 2017-02-07 | Disposition: A | Discharge status: Home / Self Care | Payer: BC Managed Care – PPO | Source: Ambulatory Visit | Attending: Orthopedic Surgery | Admitting: Orthopedic Surgery

## 2017-02-07 DIAGNOSIS — M25562 Pain in left knee: Secondary | ICD-10-CM

## 2017-03-05 ENCOUNTER — Other Ambulatory Visit: Payer: Self-pay | Admitting: Orthopedic Surgery

## 2017-03-05 DIAGNOSIS — M25561 Pain in right knee: Secondary | ICD-10-CM

## 2017-03-28 ENCOUNTER — Ambulatory Visit
Admission: RE | Admit: 2017-03-28 | Discharge: 2017-03-28 | Disposition: A | Discharge status: Home / Self Care | Payer: BC Managed Care – PPO | Source: Ambulatory Visit | Attending: Orthopedic Surgery | Admitting: Orthopedic Surgery

## 2017-03-28 DIAGNOSIS — M25561 Pain in right knee: Secondary | ICD-10-CM

## 2017-05-12 NOTE — Patient Instructions (Signed)
Lyann Hagstrom  05/12/2017   Your procedure is scheduled on: 05-17-17  Report to [facility] Main  Entrance   Report to admitting at   1100 AM   Call this number if you have problems the morning of surgery  [phone]   Remember: ONLY 1 PERSON MAY GO WITH YOU TO SHORT STAY TO GET  READY MORNING OF [place].   Do not eat food  :After Midnight.you may have clear liquids until 0730 am then nothing by mouth     Take these medicines the morning of surgery with A SIP OF WATER: protonix, mirapex, gabapentin celexa amlodipine                                You may not have any metal on your body including hair pins and              piercings  Do not wear jewelry, make-up, lotions, powders or perfumes, deodorant             Do not wear nail polish.  Do not shave  48 hours prior to surgery.          Do not bring valuables to the hospital. [place].  Contacts, dentures or bridgework may not be worn into surgery.  Leave suitcase in the car. After surgery it may be brought to your room.                Please read over the following fact sheets you were given:           [facility] - Preparing for Surgery Before surgery, you can play an important role.  Because skin is not sterile, your skin needs to be as free of germs as possible.  You can reduce the number of germs on your skin by washing with CHG (chlorahexidine gluconate) soap before surgery.  CHG is an antiseptic cleaner which kills germs and bonds with the skin to continue killing germs even after washing. Please DO NOT use if you have an allergy to CHG or antibacterial soaps.  If your skin becomes reddened/irritated stop using the CHG and inform your nurse when you arrive at Short Stay. Do not shave (including legs and underarms) for at least 48 hours prior to the first CHG shower.  You may shave your face/neck. Please follow these instructions  carefully:  1.  Shower with CHG Soap the night before surgery and the  morning of Surgery.  2.  If you choose to wash your hair, wash your hair first as usual with your  normal  shampoo.  3.  After you shampoo, rinse your hair and body thoroughly to remove the  shampoo.                           4.  Use CHG as you would any other liquid soap.  You can apply chg directly  to the skin and wash                       Gently with a scrungie or clean washcloth.  5.  Apply the CHG Soap to your body ONLY FROM THE  NECK DOWN.   Do not use on face/ open                           Wound or open sores. Avoid contact with eyes, ears mouth and genitals (private parts).                       Wash face,  Genitals (private parts) with your normal soap.             6.  Wash thoroughly, paying special attention to the area where your surgery  will be performed.  7.  Thoroughly rinse your body with warm water from the neck down.  8.  DO NOT shower/wash with your normal soap after using and rinsing off  the CHG Soap.                9.  Pat yourself dry with a clean towel.            10.  Wear clean pajamas.            11.  Place clean sheets on your bed the night of your first shower and do not  sleep with pets. Day of Surgery : Do not apply any lotions/deodorants the morning of surgery.  Please wear clean clothes to the hospital/surgery center.  FAILURE TO FOLLOW THESE INSTRUCTIONS MAY RESULT IN THE CANCELLATION OF YOUR SURGERY PATIENT SIGNATURE_________________________________  NURSE SIGNATURE__________________________________  ________________________________________________________________________    CLEAR LIQUID DIET   Foods Allowed                                                                     Foods Excluded  Coffee and tea, regular and decaf                             liquids that you cannot  Plain Jell-O in any flavor                                             see through such as: Fruit ices  (not with fruit pulp)                                     milk, soups, orange juice  Iced Popsicles                                    All solid food Carbonated beverages, regular and diet                                    Cranberry, grape and apple juices Sports drinks like Gatorade Lightly seasoned clear broth or consume(fat free) Sugar, honey syrup  Sample Menu Breakfast  Lunch                                     Supper Cranberry juice                    Beef broth                            Chicken broth Jell-O                                     Grape juice                           Apple juice Coffee or tea                        Jell-O                                      Popsicle                                                Coffee or tea                        Coffee or tea  _____________________________________________________________________   WHAT IS A BLOOD TRANSFUSION? Blood Transfusion Information  A transfusion is the replacement of blood or some of its parts. Blood is made up of multiple cells which provide different functions.  Red blood cells carry oxygen and are used for blood loss replacement.  White blood cells fight against infection.  Platelets control bleeding.  Plasma helps clot blood.  Other blood products are available for specialized needs, such as hemophilia or other clotting disorders. BEFORE THE TRANSFUSION  Who gives blood for transfusions?   Healthy volunteers who are fully evaluated to make sure their blood is safe. This is blood bank blood. Transfusion therapy is the safest it has ever been in the practice of medicine. Before blood is taken from a donor, a complete history is taken to make sure that person has no history of diseases nor engages in risky social behavior (examples are intravenous drug use or sexual activity with multiple partners). The donor's travel history is screened to minimize risk of  transmitting infections, such as malaria. The donated blood is tested for signs of infectious diseases, such as HIV and hepatitis. The blood is then tested to be sure it is compatible with you in order to minimize the chance of a transfusion reaction. If you or a relative donates blood, this is often done in anticipation of surgery and is not appropriate for emergency situations. It takes many days to process the donated blood. RISKS AND COMPLICATIONS Although transfusion therapy is very safe and saves many lives, the main dangers of transfusion include:   Getting an infectious disease.  Developing a transfusion reaction. This is an allergic reaction to something in the blood you were given. Every precaution is taken to prevent this. The decision to have  a blood transfusion has been considered carefully by your caregiver before blood is given. Blood is not given unless the benefits outweigh the risks. AFTER THE TRANSFUSION  Right after receiving a blood transfusion, you will usually feel much better and more energetic. This is especially true if your red blood cells have gotten low (anemic). The transfusion raises the level of the red blood cells which carry oxygen, and this usually causes an energy increase.  The nurse administering the transfusion will monitor you carefully for complications. HOME CARE INSTRUCTIONS  No special instructions are needed after a transfusion. You may find your energy is better. Speak with your caregiver about any limitations on activity for underlying diseases you may have. SEEK MEDICAL CARE IF:   Your condition is not improving after your transfusion.  You develop redness or irritation at the intravenous (IV) site. SEEK IMMEDIATE MEDICAL CARE IF:  Any of the following symptoms occur over the next 12 hours:  Shaking chills.  You have a temperature by mouth above 102 F (38.9 C), not controlled by medicine.  Chest, back, or muscle pain.  People around you  feel you are not acting correctly or are confused.  Shortness of breath or difficulty breathing.  Dizziness and fainting.  You get a rash or develop hives.  You have a decrease in urine output.  Your urine turns a dark color or changes to pink, red, or brown. Any of the following symptoms occur over the next 10 days:  You have a temperature by mouth above 102 F (38.9 C), not controlled by medicine.  Shortness of breath.  Weakness after normal activity.  The white part of the eye turns yellow (jaundice).  You have a decrease in the amount of urine or are urinating less often.  Your urine turns a dark color or changes to pink, red, or brown. Document Released: 05/15/2000 Document Revised: 08/10/2011 Document Reviewed: 01/02/2008 ExitCare Patient Information 2014 [place].  _______________________________________________________________________  Incentive Spirometer  An incentive spirometer is a tool that can help keep your lungs clear and active. This tool measures how well you are filling your lungs with each breath. Taking long deep breaths may help reverse or decrease the chance of developing breathing (pulmonary) problems (especially infection) following:  A long period of time when you are unable to move or be active. BEFORE THE PROCEDURE   If the spirometer includes an indicator to show your best effort, your nurse or respiratory therapist will set it to a desired goal.  If possible, sit up straight or lean slightly forward. Try not to slouch.  Hold the incentive spirometer in an upright position. INSTRUCTIONS FOR USE  1. Sit on the edge of your bed if possible, or sit up as far as you can in bed or on a chair. 2. Hold the incentive spirometer in an upright position. 3. Breathe out normally. 4. Place the mouthpiece in your mouth and seal your lips tightly around it. 5. Breathe in slowly and as deeply as possible, raising the piston or the ball toward the top  of the column. 6. Hold your breath for 3-5 seconds or for as long as possible. Allow the piston or ball to fall to the bottom of the column. 7. Remove the mouthpiece from your mouth and breathe out normally. 8. Rest for a few seconds and repeat Steps 1 through 7 at least 10 times every 1-2 hours when you are awake. Take your time and take a few normal breaths between  deep breaths. 9. The spirometer may include an indicator to show your best effort. Use the indicator as a goal to work toward during each repetition. 10. After each set of 10 deep breaths, practice coughing to be sure your lungs are clear. If you have an incision (the cut made at the time of surgery), support your incision when coughing by placing a pillow or rolled up towels firmly against it. Once you are able to get out of bed, walk around indoors and cough well. You may stop using the incentive spirometer when instructed by your caregiver.  RISKS AND COMPLICATIONS  Take your time so you do not get dizzy or light-headed.  If you are in pain, you may need to take or ask for pain medication before doing incentive spirometry. It is harder to take a deep breath if you are having pain. AFTER USE  Rest and breathe slowly and easily.  It can be helpful to keep track of a log of your progress. Your caregiver can provide you with a simple table to help with this. If you are using the spirometer at home, follow these instructions: [place] IF:   You are having difficultly using the spirometer.  You have trouble using the spirometer as often as instructed.  Your pain medication is not giving enough relief while using the spirometer.  You develop fever of 100.5 F (38.1 C) or higher. SEEK IMMEDIATE MEDICAL CARE IF:   You cough up bloody sputum that had not been present before.  You develop fever of 102 F (38.9 C) or greater.  You develop worsening pain at or near the incision site. MAKE SURE YOU:   Understand these  instructions.  Will watch your condition.  Will get help right away if you are not doing well or get worse. Document Released: 09/28/2006 Document Revised: 08/10/2011 Document Reviewed: 11/29/2006 [facility] Patient Information 2014 [place], [place].   ________________________________________________________________________

## 2017-05-13 ENCOUNTER — Encounter (HOSPITAL_COMMUNITY): Payer: Self-pay

## 2017-05-13 ENCOUNTER — Encounter (HOSPITAL_COMMUNITY)
Admission: RE | Admit: 2017-05-13 | Discharge: 2017-05-13 | Disposition: A | Discharge status: Home / Self Care | Payer: BC Managed Care – PPO | Source: Ambulatory Visit | Attending: Orthopedic Surgery | Admitting: Orthopedic Surgery

## 2017-05-13 ENCOUNTER — Ambulatory Visit (HOSPITAL_COMMUNITY)
Admission: RE | Admit: 2017-05-13 | Discharge: 2017-05-13 | Disposition: A | Discharge status: Home / Self Care | Payer: BC Managed Care – PPO | Source: Ambulatory Visit | Attending: Anesthesiology | Admitting: Anesthesiology

## 2017-05-13 ENCOUNTER — Other Ambulatory Visit: Payer: Self-pay

## 2017-05-13 DIAGNOSIS — M1711 Unilateral primary osteoarthritis, right knee: Secondary | ICD-10-CM | POA: Diagnosis not present

## 2017-05-13 DIAGNOSIS — Z01818 Encounter for other preprocedural examination: Secondary | ICD-10-CM | POA: Diagnosis present

## 2017-05-13 HISTORY — DX: Anxiety disorder, unspecified: F41.9

## 2017-05-13 HISTORY — DX: Gastro-esophageal reflux disease without esophagitis: K21.9

## 2017-05-13 HISTORY — DX: Anemia, unspecified: D64.9

## 2017-05-13 HISTORY — DX: Essential (primary) hypertension: I10

## 2017-05-13 HISTORY — DX: Major depressive disorder, single episode, unspecified: F32.9

## 2017-05-13 HISTORY — DX: Fibromyalgia: M79.7

## 2017-05-13 HISTORY — DX: Depression, unspecified: F32.A

## 2017-05-13 HISTORY — DX: Malignant (primary) neoplasm, unspecified: C80.1

## 2017-05-13 HISTORY — DX: Pneumonia, unspecified organism: J18.9

## 2017-05-13 LAB — SURGICAL PCR SCREEN
MRSA, PCR: NEGATIVE
Staphylococcus aureus: NEGATIVE

## 2017-05-13 NOTE — Progress Notes (Signed)
ekg 05-12-17 chart  cxr 04-09-18 chart

## 2017-05-14 NOTE — Progress Notes (Signed)
LVM of time change . Pt. To arrive at 845am . And NPO after midnight except sips with meds.

## 2017-05-14 NOTE — Progress Notes (Signed)
Called to confirm pt. Got my message about time change. Pt. Verbalized understanding.

## 2017-05-16 NOTE — H&P (Signed)
UNICOMPARTMENTAL KNEE ADMISSION H&P  Patient is being admitted for right medial unicompartmental knee arthroplasty and left knee arthroscopy  Subjective:  Chief Complaint:  1. Right knee medial compartmental primary OA /pain    2. Left knee possible lateral meniscal tear    HPI: Jari Pigg, 36 y.o. female , has a history of pain and functional disability in the bilateral and has failed non-surgical conservative treatments for greater than 12 weeks to include NSAID's and/or analgesics, corticosteriod injections, viscosupplementation injections and activity modification.  Onset of symptoms was gradual, starting ~1 years ago with rapidlly worsening course since that time in the right knee and pain for about 6 months in the left knee. The patient noted prior procedures on the knee to include  arthroscopy on the right knee and a left medial UKR.  Patient currently rates pain in the bilateral knee(s) at 5 out of 10 with activity. Patient has night pain, worsening of pain with activity and weight bearing, pain that interferes with activities of daily living, pain with passive range of motion, crepitus and joint swelling.  Patient has evidence of periarticular osteophytes and joint space narrowing of the medial compartment by imaging studies.  There is no active infection.  Risks, benefits and expectations were discussed with the patient.  Risks including but not limited to the risk of anesthesia, blood clots, nerve damage, blood vessel damage, failure of the prosthesis, infection and up to and including death.  Patient understand the risks, benefits and expectations and wishes to proceed with surgery.   PCP: Gilford Silvius, MD  D/C Plans:       Home   Post-op Meds:       No Rx given   Tranexamic Acid:      To be given - IV   Decadron:      Is to be given  FYI:      ASA  Norco  Will bring the name of the muscle relaxer that works for her  DME:   Pt already has equipment  PT:    [facility]     Past Medical History:  Diagnosis Date  . Anemia    hx of   . Anxiety   . Cancer ([place])    ovarian cancer 2015   . Depression   . Fibromyalgia   . GERD (gastroesophageal reflux disease)   . Hypertension   . Pneumonia    hx of  . Polycystic ovary disease    LMP 04/03/11  states takes Metformin for this  . PONV (postoperative nausea and vomiting)    facial flushing with body itching with anesthesia- starts then with nausea     Past Surgical History:  Procedure Laterality Date  . ABDOMINAL HYSTERECTOMY     RADICAL  . APPENDECTOMY    . arthroscopic right knee    . BACK SURGERY     lumbar discestomy  . cesarean section, right  reconstructive surgeryof ankle, 3 arthroscopies LEFT knee, 1 open knee surgery left    . FRACTURE SURGERY    . PARTIAL KNEE ARTHROPLASTY  04/20/2011   Procedure: UNICOMPARTMENTAL KNEE;  Surgeon: Mauri Pole;  Location: [facility];  Service: Orthopedics;  Laterality: Left;  Left unicompartmental knee arthroplasty/ Left Patella Femoral Replacement  . TONSILLECTOMY      Allergies  Allergen Reactions  . Levofloxacin Other (See Comments)    Joint Pain   . Vilazodone Hcl Other (See Comments)    unusual thoughts  . Penicillins Other (See Comments)  Pt does not remember Has patient had a PCN reaction causing immediate rash, facial/tongue/throat swelling, SOB or lightheadedness with hypotension: Unknown Has patient had a PCN reaction causing severe rash involving mucus membranes or skin necrosis: Unknown Has patient had a PCN reaction that required hospitalization: Unknown Has patient had a PCN reaction occurring within the last 10 years: No If all of the above answers are "NO", then may proceed with Cephalosporin use.     Social History   Tobacco Use  . Smoking status: Never Smoker  . Smokeless tobacco: Never Used  Substance Use Topics  . Alcohol use: No  . Drug use: No       Review of Systems  Constitutional: Negative.   HENT:  Negative.   Eyes: Negative.   Respiratory: Negative.   Cardiovascular: Negative.   Gastrointestinal: Positive for heartburn.  Genitourinary: Negative.   Musculoskeletal: Positive for joint pain.  Skin: Negative.   Neurological: Negative.   Endo/Heme/Allergies: Negative.   Psychiatric/Behavioral: Positive for depression. The patient is nervous/anxious.      Objective:   Physical Exam  Constitutional: She is well-developed, well-nourished, and in no distress.  Eyes: Pupils are equal, round, and reactive to light.  Neck: Neck supple. No JVD present. No tracheal deviation present. No thyromegaly present.  Cardiovascular: Normal rate, regular rhythm and intact distal pulses.  Pulmonary/Chest: Effort normal and breath sounds normal. No respiratory distress. She has no wheezes.  Abdominal: Soft. There is no tenderness. There is no guarding.  Musculoskeletal:       Right knee: She exhibits decreased range of motion, swelling and bony tenderness. She exhibits no ecchymosis, no deformity, no laceration and no erythema. Tenderness found. Medial joint line tenderness noted. No lateral joint line tenderness noted.       Left knee: She exhibits swelling, laceration (healed from previous surgery) and bony tenderness. She exhibits no ecchymosis, no deformity and no erythema. Tenderness found. Lateral joint line tenderness noted. No medial joint line tenderness noted.  Lymphadenopathy:    She has no cervical adenopathy.  Neurological: She is alert.  Skin: Skin is warm and dry.       Labs:  Estimated body mass index is 35.11 kg/m as calculated from the following:   Height as of 05/13/17: 5\' 5"  (1.651 m).   Weight as of 05/13/17: 95.7 kg (211 lb).   Imaging Review Plain radiographs demonstrate severe degenerative joint disease of the right knee(s) medial compartment. The overall alignment is neutral. The bone quality appears to be good for age and reported activity  level.  Assessment/Plan:  End stage arthritis, right knee medial compartment and possible left lateral meniscal tear  The patient history, physical examination, clinical judgment of the provider and imaging studies are consistent with end stage degenerative joint disease of the right knee and LMT in the left knee and medial unicompartmental knee arthroplasty is deemed medically necessary on the right kne and arthroscopy in the left knee. The treatment options including medical management, injection therapy arthroscopy and arthroplasty were discussed at length. The risks and benefits of total knee arthroplasty were presented and reviewed. The risks due to aseptic loosening, infection, stiffness, patella tracking problems, thromboembolic complications and other imponderables were discussed. The patient acknowledged the explanation, agreed to proceed with the plan and consent was signed. Patient is being admitted for outpatient / observation treatment for surgery, pain control, PT, OT, prophylactic antibiotics, VTE prophylaxis, progressive ambulation and ADL's and discharge planning. The patient is planning to be discharged home  with home health services.    West Pugh Garold Sheeler   PA-C  05/16/2017, 9:59 PM

## 2017-05-17 ENCOUNTER — Other Ambulatory Visit: Payer: Self-pay

## 2017-05-17 ENCOUNTER — Ambulatory Visit (HOSPITAL_COMMUNITY): Payer: BC Managed Care – PPO | Admitting: Certified Registered Nurse Anesthetist

## 2017-05-17 ENCOUNTER — Observation Stay (HOSPITAL_COMMUNITY)
Admission: RE | Admit: 2017-05-17 | Discharge: 2017-05-18 | Disposition: A | Discharge status: Home / Self Care | Payer: BC Managed Care – PPO | Source: Ambulatory Visit | Attending: Orthopedic Surgery | Admitting: Orthopedic Surgery

## 2017-05-17 ENCOUNTER — Encounter (HOSPITAL_COMMUNITY)
Admission: RE | Disposition: A | Discharge status: Home / Self Care | Payer: Self-pay | Source: Ambulatory Visit | Attending: Orthopedic Surgery

## 2017-05-17 ENCOUNTER — Encounter (HOSPITAL_COMMUNITY): Payer: Self-pay

## 2017-05-17 DIAGNOSIS — M1711 Unilateral primary osteoarthritis, right knee: Principal | ICD-10-CM | POA: Insufficient documentation

## 2017-05-17 DIAGNOSIS — F419 Anxiety disorder, unspecified: Secondary | ICD-10-CM | POA: Diagnosis not present

## 2017-05-17 DIAGNOSIS — Z880 Allergy status to penicillin: Secondary | ICD-10-CM | POA: Diagnosis not present

## 2017-05-17 DIAGNOSIS — Z888 Allergy status to other drugs, medicaments and biological substances status: Secondary | ICD-10-CM | POA: Insufficient documentation

## 2017-05-17 DIAGNOSIS — F329 Major depressive disorder, single episode, unspecified: Secondary | ICD-10-CM | POA: Diagnosis not present

## 2017-05-17 DIAGNOSIS — Z7982 Long term (current) use of aspirin: Secondary | ICD-10-CM | POA: Insufficient documentation

## 2017-05-17 DIAGNOSIS — Z8543 Personal history of malignant neoplasm of ovary: Secondary | ICD-10-CM | POA: Insufficient documentation

## 2017-05-17 DIAGNOSIS — Z881 Allergy status to other antibiotic agents status: Secondary | ICD-10-CM | POA: Insufficient documentation

## 2017-05-17 DIAGNOSIS — M25761 Osteophyte, right knee: Secondary | ICD-10-CM | POA: Insufficient documentation

## 2017-05-17 DIAGNOSIS — I10 Essential (primary) hypertension: Secondary | ICD-10-CM | POA: Diagnosis not present

## 2017-05-17 DIAGNOSIS — Z79899 Other long term (current) drug therapy: Secondary | ICD-10-CM | POA: Diagnosis not present

## 2017-05-17 DIAGNOSIS — Z6835 Body mass index (BMI) 35.0-35.9, adult: Secondary | ICD-10-CM | POA: Diagnosis not present

## 2017-05-17 DIAGNOSIS — Z7901 Long term (current) use of anticoagulants: Secondary | ICD-10-CM | POA: Insufficient documentation

## 2017-05-17 DIAGNOSIS — M797 Fibromyalgia: Secondary | ICD-10-CM | POA: Insufficient documentation

## 2017-05-17 DIAGNOSIS — Z96651 Presence of right artificial knee joint: Secondary | ICD-10-CM

## 2017-05-17 DIAGNOSIS — K219 Gastro-esophageal reflux disease without esophagitis: Secondary | ICD-10-CM | POA: Diagnosis not present

## 2017-05-17 DIAGNOSIS — E669 Obesity, unspecified: Secondary | ICD-10-CM | POA: Diagnosis not present

## 2017-05-17 HISTORY — PX: PARTIAL KNEE ARTHROPLASTY: SHX2174

## 2017-05-17 HISTORY — PX: KNEE ARTHROSCOPY WITH LATERAL MENISECTOMY: SHX6193

## 2017-05-17 LAB — TYPE AND SCREEN
ABO/RH(D): B POS
Antibody Screen: NEGATIVE

## 2017-05-17 SURGERY — ARTHROPLASTY, KNEE, UNICOMPARTMENTAL
Anesthesia: Regional | Site: Knee | Laterality: Right

## 2017-05-17 MED ORDER — DIPHENHYDRAMINE HCL 12.5 MG/5ML PO ELIX
12.5000 mg | ORAL_SOLUTION | ORAL | Status: DC | PRN
  Administered 2017-05-17 – 2017-05-18 (×4): 25 mg via ORAL
  Filled 2017-05-17 (×4): qty 10

## 2017-05-17 MED ORDER — METOCLOPRAMIDE HCL 5 MG PO TABS: 5.0000 mg | ORAL_TABLET | Freq: Three times a day (TID) | ORAL | Status: DC | PRN

## 2017-05-17 MED ORDER — CEFAZOLIN SODIUM-DEXTROSE 2-4 GM/100ML-% IV SOLN
2.0000 g | INTRAVENOUS | Status: AC
  Administered 2017-05-17: 2 g via INTRAVENOUS

## 2017-05-17 MED ORDER — MAGNESIUM CITRATE PO SOLN: 1.0000 | Freq: Once | ORAL | Status: DC | PRN

## 2017-05-17 MED ORDER — DICYCLOMINE HCL 10 MG PO CAPS
10.0000 mg | ORAL_CAPSULE | Freq: Four times a day (QID) | ORAL | Status: DC | PRN
  Filled 2017-05-17: qty 1

## 2017-05-17 MED ORDER — PROPOFOL 10 MG/ML IV BOLUS
INTRAVENOUS | Status: DC | PRN
  Administered 2017-05-17: 20 mg via INTRAVENOUS
  Administered 2017-05-17: 150 mg via INTRAVENOUS
  Administered 2017-05-17: 100 mg via INTRAVENOUS
  Administered 2017-05-17 (×2): 20 mg via INTRAVENOUS

## 2017-05-17 MED ORDER — PHENYLEPHRINE HCL 10 MG/ML IJ SOLN
INTRAMUSCULAR | Status: DC | PRN
  Administered 2017-05-17 (×2): 80 ug via INTRAVENOUS
  Administered 2017-05-17: 120 ug via INTRAVENOUS
  Administered 2017-05-17 (×3): 80 ug via INTRAVENOUS

## 2017-05-17 MED ORDER — DICYCLOMINE HCL 10 MG PO CAPS: 10.0000 mg | ORAL_CAPSULE | Freq: Four times a day (QID) | ORAL | 1 refills | Status: DC | PRN

## 2017-05-17 MED ORDER — ONDANSETRON HCL 4 MG/2ML IJ SOLN
INTRAMUSCULAR | Status: AC
  Filled 2017-05-17: qty 2

## 2017-05-17 MED ORDER — SODIUM CHLORIDE 0.9 % IV SOLN
INTRAVENOUS | Status: DC
  Administered 2017-05-17 (×2): via INTRAVENOUS

## 2017-05-17 MED ORDER — DEXTROSE 5 % IV SOLN
500.0000 mg | Freq: Four times a day (QID) | INTRAVENOUS | Status: DC | PRN
  Administered 2017-05-17: 500 mg via INTRAVENOUS
  Filled 2017-05-17: qty 550

## 2017-05-17 MED ORDER — SCOPOLAMINE 1 MG/3DAYS TD PT72
MEDICATED_PATCH | TRANSDERMAL | Status: AC
  Filled 2017-05-17: qty 1

## 2017-05-17 MED ORDER — KETOROLAC TROMETHAMINE 30 MG/ML IJ SOLN
INTRAMUSCULAR | Status: DC | PRN
  Administered 2017-05-17 (×2): 15 mg via INTRA_ARTICULAR

## 2017-05-17 MED ORDER — CEFAZOLIN SODIUM-DEXTROSE 2-4 GM/100ML-% IV SOLN
2.0000 g | Freq: Four times a day (QID) | INTRAVENOUS | Status: AC
  Administered 2017-05-17 – 2017-05-18 (×2): 2 g via INTRAVENOUS
  Filled 2017-05-17 (×2): qty 100

## 2017-05-17 MED ORDER — METHYLPHENIDATE HCL ER 20 MG PO TBCR: 20.0000 mg | EXTENDED_RELEASE_TABLET | Freq: Every day | ORAL | Status: DC

## 2017-05-17 MED ORDER — TRANEXAMIC ACID 1000 MG/10ML IV SOLN
1000.0000 mg | INTRAVENOUS | Status: AC
  Administered 2017-05-17: 1000 mg via INTRAVENOUS
  Filled 2017-05-17: qty 1100

## 2017-05-17 MED ORDER — FENTANYL CITRATE (PF) 100 MCG/2ML IJ SOLN
INTRAMUSCULAR | Status: AC
  Filled 2017-05-17: qty 2

## 2017-05-17 MED ORDER — ONDANSETRON HCL 4 MG/2ML IJ SOLN: 4.0000 mg | Freq: Once | INTRAMUSCULAR | Status: DC | PRN

## 2017-05-17 MED ORDER — HYDROMORPHONE HCL 1 MG/ML IJ SOLN
0.5000 mg | INTRAMUSCULAR | Status: DC | PRN
  Administered 2017-05-17 – 2017-05-18 (×3): 1 mg via INTRAVENOUS
  Filled 2017-05-17 (×3): qty 1

## 2017-05-17 MED ORDER — MIDAZOLAM HCL 2 MG/2ML IJ SOLN
INTRAMUSCULAR | Status: AC
  Filled 2017-05-17: qty 2

## 2017-05-17 MED ORDER — CHLORHEXIDINE GLUCONATE 4 % EX LIQD: 60.0000 mL | Freq: Once | CUTANEOUS | Status: DC

## 2017-05-17 MED ORDER — BUPIVACAINE-EPINEPHRINE 0.25% -1:200000 IJ SOLN
INTRAMUSCULAR | Status: DC | PRN
  Administered 2017-05-17 (×2): 15 mL

## 2017-05-17 MED ORDER — SCOPOLAMINE 1 MG/3DAYS TD PT72
1.0000 | MEDICATED_PATCH | Freq: Once | TRANSDERMAL | Status: DC
  Administered 2017-05-17: 1.5 mg via TRANSDERMAL

## 2017-05-17 MED ORDER — METHOCARBAMOL 1000 MG/10ML IJ SOLN
500.0000 mg | Freq: Four times a day (QID) | INTRAVENOUS | Status: DC | PRN
  Filled 2017-05-17: qty 5

## 2017-05-17 MED ORDER — POLYETHYLENE GLYCOL 3350 17 G PO PACK: 17.0000 g | PACK | Freq: Two times a day (BID) | ORAL | 0 refills | Status: AC

## 2017-05-17 MED ORDER — HYDROCODONE-ACETAMINOPHEN 7.5-325 MG PO TABS: 1.0000 | ORAL_TABLET | Freq: Four times a day (QID) | ORAL | Status: DC | PRN

## 2017-05-17 MED ORDER — LACTATED RINGERS IV SOLN
INTRAVENOUS | Status: DC
  Administered 2017-05-17: 1000 mL via INTRAVENOUS
  Administered 2017-05-17: 13:00:00 via INTRAVENOUS

## 2017-05-17 MED ORDER — ASPIRIN 81 MG PO CHEW
81.0000 mg | CHEWABLE_TABLET | Freq: Two times a day (BID) | ORAL | Status: DC
  Administered 2017-05-17 – 2017-05-18 (×2): 81 mg via ORAL
  Filled 2017-05-17 (×2): qty 1

## 2017-05-17 MED ORDER — SODIUM CHLORIDE 0.9 % IV SOLN
1000.0000 mg | Freq: Once | INTRAVENOUS | Status: AC
  Administered 2017-05-17: 1000 mg via INTRAVENOUS
  Filled 2017-05-17: qty 1100

## 2017-05-17 MED ORDER — BUPIVACAINE-EPINEPHRINE (PF) 0.25% -1:200000 IJ SOLN
INTRAMUSCULAR | Status: AC
Start: 2017-05-17 — End: 2017-05-17
  Filled 2017-05-17: qty 30

## 2017-05-17 MED ORDER — METOCLOPRAMIDE HCL 5 MG/ML IJ SOLN: 5.0000 mg | Freq: Three times a day (TID) | INTRAMUSCULAR | Status: DC | PRN

## 2017-05-17 MED ORDER — TIZANIDINE HCL 4 MG PO TABS
4.0000 mg | ORAL_TABLET | Freq: Four times a day (QID) | ORAL | Status: DC | PRN
  Administered 2017-05-17 – 2017-05-18 (×4): 4 mg via ORAL
  Filled 2017-05-17 (×5): qty 1

## 2017-05-17 MED ORDER — PROMETHAZINE HCL 25 MG PO TABS: 12.5000 mg | ORAL_TABLET | Freq: Four times a day (QID) | ORAL | Status: DC | PRN

## 2017-05-17 MED ORDER — GUAIFENESIN ER 600 MG PO TB12
600.0000 mg | ORAL_TABLET | Freq: Two times a day (BID) | ORAL | Status: DC
  Administered 2017-05-17 – 2017-05-18 (×2): 600 mg via ORAL
  Filled 2017-05-17 (×2): qty 1

## 2017-05-17 MED ORDER — ONDANSETRON HCL 4 MG/2ML IJ SOLN
INTRAMUSCULAR | Status: DC | PRN
  Administered 2017-05-17: 4 mg via INTRAVENOUS

## 2017-05-17 MED ORDER — PRAMIPEXOLE DIHYDROCHLORIDE 0.25 MG PO TABS
1.0000 mg | ORAL_TABLET | Freq: Two times a day (BID) | ORAL | Status: DC
  Administered 2017-05-17 – 2017-05-18 (×2): 1 mg via ORAL
  Filled 2017-05-17 (×2): qty 4

## 2017-05-17 MED ORDER — MIDAZOLAM HCL 5 MG/5ML IJ SOLN
INTRAMUSCULAR | Status: DC | PRN
  Administered 2017-05-17: 2 mg via INTRAVENOUS

## 2017-05-17 MED ORDER — MENTHOL 3 MG MT LOZG: 1.0000 | LOZENGE | OROMUCOSAL | Status: DC | PRN

## 2017-05-17 MED ORDER — BISACODYL 10 MG RE SUPP: 10.0000 mg | Freq: Every day | RECTAL | Status: DC | PRN

## 2017-05-17 MED ORDER — HYDROMORPHONE HCL 1 MG/ML IJ SOLN
INTRAMUSCULAR | Status: AC
  Administered 2017-05-17: 0.5 mg via INTRAVENOUS
  Filled 2017-05-17: qty 1

## 2017-05-17 MED ORDER — LAMOTRIGINE 100 MG PO TABS
100.0000 mg | ORAL_TABLET | Freq: Every day | ORAL | Status: DC
  Administered 2017-05-17: 23:00:00 100 mg via ORAL
  Filled 2017-05-17: qty 1

## 2017-05-17 MED ORDER — LACTATED RINGERS IR SOLN
Status: DC | PRN
  Administered 2017-05-17: 3000 mL

## 2017-05-17 MED ORDER — HYDROCODONE-ACETAMINOPHEN 7.5-325 MG PO TABS: 1.0000 | ORAL_TABLET | ORAL | 0 refills | Status: AC | PRN

## 2017-05-17 MED ORDER — LORATADINE 10 MG PO TABS
10.0000 mg | ORAL_TABLET | Freq: Every day | ORAL | Status: DC
  Administered 2017-05-18: 10 mg via ORAL
  Filled 2017-05-17: qty 1

## 2017-05-17 MED ORDER — DEXAMETHASONE SODIUM PHOSPHATE 10 MG/ML IJ SOLN
INTRAMUSCULAR | Status: AC
  Filled 2017-05-17: qty 1

## 2017-05-17 MED ORDER — ACETAMINOPHEN 325 MG PO TABS: 650.0000 mg | ORAL_TABLET | ORAL | Status: DC | PRN

## 2017-05-17 MED ORDER — POLYETHYLENE GLYCOL 3350 17 G PO PACK: 17.0000 g | PACK | Freq: Two times a day (BID) | ORAL | Status: DC

## 2017-05-17 MED ORDER — KETOROLAC TROMETHAMINE 30 MG/ML IJ SOLN
INTRAMUSCULAR | Status: AC
  Filled 2017-05-17: qty 1

## 2017-05-17 MED ORDER — METHYLPHENIDATE HCL ER (CD) 20 MG PO CPCR: 20.0000 mg | ORAL_CAPSULE | Freq: Every day | ORAL | Status: DC

## 2017-05-17 MED ORDER — HYDROMORPHONE HCL 1 MG/ML IJ SOLN
0.2500 mg | INTRAMUSCULAR | Status: DC | PRN
  Administered 2017-05-17 (×3): 0.5 mg via INTRAVENOUS

## 2017-05-17 MED ORDER — FENTANYL CITRATE (PF) 250 MCG/5ML IJ SOLN
INTRAMUSCULAR | Status: AC
  Filled 2017-05-17: qty 5

## 2017-05-17 MED ORDER — MIDAZOLAM HCL 2 MG/2ML IJ SOLN
1.0000 mg | INTRAMUSCULAR | Status: DC | PRN
  Administered 2017-05-17: 2 mg via INTRAVENOUS

## 2017-05-17 MED ORDER — DEXAMETHASONE SODIUM PHOSPHATE 10 MG/ML IJ SOLN
10.0000 mg | Freq: Once | INTRAMUSCULAR | Status: AC
  Administered 2017-05-17: 10 mg via INTRAVENOUS

## 2017-05-17 MED ORDER — DICYCLOMINE HCL 10 MG PO CAPS: 10.0000 mg | ORAL_CAPSULE | Freq: Four times a day (QID) | ORAL | 1 refills | Status: AC | PRN

## 2017-05-17 MED ORDER — HYDROMORPHONE HCL 1 MG/ML IJ SOLN
INTRAMUSCULAR | Status: AC
  Filled 2017-05-17: qty 1

## 2017-05-17 MED ORDER — ROPIVACAINE HCL 7.5 MG/ML IJ SOLN
INTRAMUSCULAR | Status: DC | PRN
  Administered 2017-05-17: 20 mL via PERINEURAL

## 2017-05-17 MED ORDER — SODIUM CHLORIDE 0.9 % IJ SOLN
INTRAMUSCULAR | Status: AC
  Filled 2017-05-17: qty 50

## 2017-05-17 MED ORDER — DOCUSATE SODIUM 100 MG PO CAPS
100.0000 mg | ORAL_CAPSULE | Freq: Two times a day (BID) | ORAL | Status: DC
  Administered 2017-05-17 – 2017-05-18 (×2): 100 mg via ORAL
  Filled 2017-05-17 (×2): qty 1

## 2017-05-17 MED ORDER — ACETAMINOPHEN 650 MG RE SUPP: 650.0000 mg | RECTAL | Status: DC | PRN

## 2017-05-17 MED ORDER — FERROUS SULFATE 325 (65 FE) MG PO TABS: 325.0000 mg | ORAL_TABLET | Freq: Three times a day (TID) | ORAL | 3 refills | Status: AC

## 2017-05-17 MED ORDER — FERROUS SULFATE 325 (65 FE) MG PO TABS
325.0000 mg | ORAL_TABLET | Freq: Three times a day (TID) | ORAL | Status: DC
  Administered 2017-05-18: 325 mg via ORAL
  Filled 2017-05-17: qty 1

## 2017-05-17 MED ORDER — CITALOPRAM HYDROBROMIDE 20 MG PO TABS
40.0000 mg | ORAL_TABLET | Freq: Every day | ORAL | Status: DC
  Administered 2017-05-18: 10:00:00 40 mg via ORAL
  Filled 2017-05-17: qty 2

## 2017-05-17 MED ORDER — GABAPENTIN 300 MG PO CAPS
900.0000 mg | ORAL_CAPSULE | Freq: Two times a day (BID) | ORAL | Status: DC
  Administered 2017-05-17 – 2017-05-18 (×2): 900 mg via ORAL
  Filled 2017-05-17 (×2): qty 3

## 2017-05-17 MED ORDER — EPHEDRINE SULFATE 50 MG/ML IJ SOLN
INTRAMUSCULAR | Status: DC | PRN
  Administered 2017-05-17 (×3): 10 mg via INTRAVENOUS

## 2017-05-17 MED ORDER — TIZANIDINE HCL 4 MG PO TABS: 4.0000 mg | ORAL_TABLET | Freq: Four times a day (QID) | ORAL | 0 refills | Status: AC | PRN

## 2017-05-17 MED ORDER — ASPIRIN 81 MG PO CHEW: 81.0000 mg | CHEWABLE_TABLET | Freq: Two times a day (BID) | ORAL | 0 refills | Status: AC

## 2017-05-17 MED ORDER — DEXAMETHASONE SODIUM PHOSPHATE 10 MG/ML IJ SOLN
10.0000 mg | Freq: Once | INTRAMUSCULAR | Status: AC
  Administered 2017-05-18: 10 mg via INTRAVENOUS
  Filled 2017-05-17: qty 1

## 2017-05-17 MED ORDER — FENTANYL CITRATE (PF) 100 MCG/2ML IJ SOLN: 50.0000 ug | INTRAMUSCULAR | Status: DC | PRN

## 2017-05-17 MED ORDER — ONDANSETRON HCL 4 MG PO TABS: 4.0000 mg | ORAL_TABLET | Freq: Four times a day (QID) | ORAL | Status: DC | PRN

## 2017-05-17 MED ORDER — AMLODIPINE BESYLATE 5 MG PO TABS
5.0000 mg | ORAL_TABLET | Freq: Every day | ORAL | Status: DC
  Administered 2017-05-17: 18:00:00 5 mg via ORAL
  Filled 2017-05-17 (×2): qty 1

## 2017-05-17 MED ORDER — CELECOXIB 200 MG PO CAPS
200.0000 mg | ORAL_CAPSULE | Freq: Two times a day (BID) | ORAL | Status: DC
  Administered 2017-05-17 – 2017-05-18 (×2): 200 mg via ORAL
  Filled 2017-05-17 (×2): qty 1

## 2017-05-17 MED ORDER — PANTOPRAZOLE SODIUM 40 MG PO TBEC
40.0000 mg | DELAYED_RELEASE_TABLET | Freq: Two times a day (BID) | ORAL | Status: DC
  Administered 2017-05-17 – 2017-05-18 (×2): 40 mg via ORAL
  Filled 2017-05-17 (×2): qty 1

## 2017-05-17 MED ORDER — METHOCARBAMOL 500 MG PO TABS: 500.0000 mg | ORAL_TABLET | Freq: Four times a day (QID) | ORAL | Status: DC | PRN

## 2017-05-17 MED ORDER — HYDROCODONE-ACETAMINOPHEN 7.5-325 MG PO TABS
2.0000 | ORAL_TABLET | Freq: Four times a day (QID) | ORAL | Status: DC | PRN
  Administered 2017-05-17 – 2017-05-18 (×4): 2 via ORAL
  Filled 2017-05-17 (×4): qty 2

## 2017-05-17 MED ORDER — ASPIRIN 81 MG PO CHEW: 81.0000 mg | CHEWABLE_TABLET | Freq: Two times a day (BID) | ORAL | 0 refills | Status: DC

## 2017-05-17 MED ORDER — DOCUSATE SODIUM 100 MG PO CAPS: 100.0000 mg | ORAL_CAPSULE | Freq: Two times a day (BID) | ORAL | 0 refills | Status: AC

## 2017-05-17 MED ORDER — FENTANYL CITRATE (PF) 100 MCG/2ML IJ SOLN
25.0000 ug | INTRAMUSCULAR | Status: DC | PRN
  Administered 2017-05-17 (×2): 50 ug via INTRAVENOUS

## 2017-05-17 MED ORDER — CEFAZOLIN SODIUM-DEXTROSE 2-4 GM/100ML-% IV SOLN
INTRAVENOUS | Status: AC
  Filled 2017-05-17: qty 100

## 2017-05-17 MED ORDER — TRAZODONE HCL 100 MG PO TABS: 100.0000 mg | ORAL_TABLET | Freq: Every evening | ORAL | Status: DC | PRN

## 2017-05-17 MED ORDER — PROPOFOL 10 MG/ML IV BOLUS
INTRAVENOUS | Status: AC
  Filled 2017-05-17: qty 40

## 2017-05-17 MED ORDER — FENTANYL CITRATE (PF) 100 MCG/2ML IJ SOLN
INTRAMUSCULAR | Status: DC | PRN
  Administered 2017-05-17 (×2): 50 ug via INTRAVENOUS
  Administered 2017-05-17: 25 ug via INTRAVENOUS
  Administered 2017-05-17: 100 ug via INTRAVENOUS
  Administered 2017-05-17: 50 ug via INTRAVENOUS

## 2017-05-17 MED ORDER — SODIUM CHLORIDE 0.9 % IJ SOLN
INTRAMUSCULAR | Status: DC | PRN
  Administered 2017-05-17 (×2): 15 mL

## 2017-05-17 MED ORDER — HYDROCODONE-ACETAMINOPHEN 7.5-325 MG PO TABS: 2.0000 | ORAL_TABLET | Freq: Four times a day (QID) | ORAL | Status: DC | PRN

## 2017-05-17 MED ORDER — ALUM & MAG HYDROXIDE-SIMETH 200-200-20 MG/5ML PO SUSP: 15.0000 mL | ORAL | Status: DC | PRN

## 2017-05-17 MED ORDER — ONDANSETRON HCL 4 MG/2ML IJ SOLN: 4.0000 mg | Freq: Four times a day (QID) | INTRAMUSCULAR | Status: DC | PRN

## 2017-05-17 MED ORDER — PHENOL 1.4 % MT LIQD
1.0000 | OROMUCOSAL | Status: DC | PRN
  Filled 2017-05-17: qty 177

## 2017-05-17 SURGICAL SUPPLY — 52 items
BAG DECANTER FOR FLEXI CONT (MISCELLANEOUS) IMPLANT
BAG ZIPLOCK 12X15 (MISCELLANEOUS) IMPLANT
BANDAGE ACE 6X5 VEL STRL LF (GAUZE/BANDAGES/DRESSINGS) ×12 IMPLANT
BLADE CUDA SHAVER 3.5 (BLADE) ×4 IMPLANT
BLADE SAW RECIPROCATING 77.5 (BLADE) ×4 IMPLANT
BLADE SAW SGTL 11.0X1.19X90.0M (BLADE) IMPLANT
BLADE SAW SGTL 13.0X1.19X90.0M (BLADE) ×4 IMPLANT
BLADE SURG SZ11 CARB STEEL (BLADE) IMPLANT
BNDG GAUZE ELAST 4 BULKY (GAUZE/BANDAGES/DRESSINGS) ×4 IMPLANT
BOWL SMART MIX CTS (DISPOSABLE) ×4 IMPLANT
CAPT KNEE PARTIAL 2 ×4 IMPLANT
CEMENT HV SMART SET (Cement) ×4 IMPLANT
CLOTH BEACON ORANGE TIMEOUT ST (SAFETY) ×4 IMPLANT
COVER SURGICAL LIGHT HANDLE (MISCELLANEOUS) ×4 IMPLANT
CUFF TOURN SGL QUICK 34 (TOURNIQUET CUFF) ×2
CUFF TRNQT CYL 34X4X40X1 (TOURNIQUET CUFF) ×2 IMPLANT
DERMABOND ADVANCED (GAUZE/BANDAGES/DRESSINGS) ×2
DERMABOND ADVANCED .7 DNX12 (GAUZE/BANDAGES/DRESSINGS) ×2 IMPLANT
DRAPE U-SHAPE 47X51 STRL (DRAPES) ×4 IMPLANT
DRSG AQUACEL AG ADV 3.5X10 (GAUZE/BANDAGES/DRESSINGS) ×4 IMPLANT
DRSG EMULSION OIL 3X3 NADH (GAUZE/BANDAGES/DRESSINGS) ×4 IMPLANT
DRSG PAD ABDOMINAL 8X10 ST (GAUZE/BANDAGES/DRESSINGS) ×12 IMPLANT
DURAPREP 26ML APPLICATOR (WOUND CARE) ×8 IMPLANT
ELECT REM PT RETURN 15FT ADLT (MISCELLANEOUS) ×4 IMPLANT
GAUZE SPONGE 4X4 12PLY STRL (GAUZE/BANDAGES/DRESSINGS) ×4 IMPLANT
GLOVE BIOGEL M 7.0 STRL (GLOVE) IMPLANT
GLOVE BIOGEL PI IND STRL 7.5 (GLOVE) ×12 IMPLANT
GLOVE BIOGEL PI IND STRL 8.5 (GLOVE) ×2 IMPLANT
GLOVE BIOGEL PI INDICATOR 7.5 (GLOVE) ×12
GLOVE BIOGEL PI INDICATOR 8.5 (GLOVE) ×2
GLOVE ECLIPSE 8.0 STRL XLNG CF (GLOVE) ×8 IMPLANT
GLOVE ORTHO TXT STRL SZ7.5 (GLOVE) ×8 IMPLANT
GLOVE SURG ORTHO 8.0 STRL STRW (GLOVE) ×4 IMPLANT
GOWN STRL REUS W/TWL LRG LVL3 (GOWN DISPOSABLE) ×8 IMPLANT
GOWN STRL REUS W/TWL XL LVL3 (GOWN DISPOSABLE) ×8 IMPLANT
KIT BASIN OR (CUSTOM PROCEDURE TRAY) IMPLANT
MANIFOLD NEPTUNE II (INSTRUMENTS) ×4 IMPLANT
PACK TOTAL KNEE CUSTOM (KITS) ×4 IMPLANT
PADDING CAST COTTON 6X4 STRL (CAST SUPPLIES) ×4 IMPLANT
POSITIONER SURGICAL ARM (MISCELLANEOUS) ×4 IMPLANT
SUT ETHILON 4 0 PS 2 18 (SUTURE) ×4 IMPLANT
SUT MNCRL AB 4-0 PS2 18 (SUTURE) ×4 IMPLANT
SUT STRATAFIX 0 PDS 27 VIOLET (SUTURE) ×4
SUT VIC AB 1 CT1 36 (SUTURE) ×4 IMPLANT
SUT VIC AB 2-0 CT1 27 (SUTURE) ×4
SUT VIC AB 2-0 CT1 TAPERPNT 27 (SUTURE) ×4 IMPLANT
SUTURE STRATFX 0 PDS 27 VIOLET (SUTURE) ×2 IMPLANT
SYR 50ML LL SCALE MARK (SYRINGE) ×4 IMPLANT
TOWEL OR 17X26 10 PK STRL BLUE (TOWEL DISPOSABLE) ×8 IMPLANT
TRAY FOLEY W/METER SILVER 16FR (SET/KITS/TRAYS/PACK) IMPLANT
TUBING ARTHRO INFLOW-ONLY STRL (TUBING) ×4 IMPLANT
WRAP KNEE MAXI GEL POST OP (GAUZE/BANDAGES/DRESSINGS) ×8 IMPLANT

## 2017-05-17 NOTE — Progress Notes (Signed)
AssistedDr. Ellender with right, ultrasound guided, adductor canal block. Side rails up, monitors on throughout procedure. See vital signs in flow sheet. Tolerated Procedure well.  

## 2017-05-17 NOTE — Anesthesia Procedure Notes (Signed)
Procedure Name: LMA Insertion Date/Time: 05/17/2017 12:02 PM Performed by: West Pugh, CRNA Pre-anesthesia Checklist: Patient identified, Emergency Drugs available, Suction available, Patient being monitored and Timeout performed Patient Re-evaluated:Patient Re-evaluated prior to induction Oxygen Delivery Method: Circle system utilized Preoxygenation: Pre-oxygenation with 100% oxygen Induction Type: IV induction LMA: LMA with gastric port inserted LMA Size: 4.0 Number of attempts: 1 Placement Confirmation: positive ETCO2 and CO2 detector Tube secured with: Tape Dental Injury: Teeth and Oropharynx as per pre-operative assessment  Comments: LMA inserted x 1 attempt. Head, neck and spine maintained in neutral alignment throughout procedure.

## 2017-05-17 NOTE — Anesthesia Postprocedure Evaluation (Signed)
Anesthesia Post Note  Patient: Vicki Yu  Procedure(s) Performed: UNICOMPARTMENTAL KNEE-Medially (Right Knee) KNEE ARTHROSCOPY WITH DEBRIDEMENT (Left Knee)     Patient location during evaluation: PACU Anesthesia Type: Regional and General Level of consciousness: awake and alert Pain management: pain level controlled Vital Signs Assessment: post-procedure vital signs reviewed and stable Respiratory status: spontaneous breathing, nonlabored ventilation, respiratory function stable and patient connected to nasal cannula oxygen Cardiovascular status: blood pressure returned to baseline and stable Postop Assessment: no apparent nausea or vomiting Anesthetic complications: no    Last Vitals:  Vitals:   05/17/17 1540 05/17/17 1545  BP:  (!) 128/98  Pulse:  95  Resp:  20  Temp:    SpO2: 100% 100%    Last Pain:  Vitals:   05/17/17 1545  TempSrc:   PainSc: 5     LLE Motor Response: Purposeful movement (05/17/17 1545) LLE Sensation: Full sensation (05/17/17 1545) RLE Motor Response: Purposeful movement (05/17/17 1545) RLE Sensation: Full sensation (05/17/17 1545) L Sensory Level: S1-Sole of foot, small toes (05/17/17 1545) R Sensory Level: S1-Sole of foot, small toes (05/17/17 1545)  Adaya Garmany P Grant Henkes

## 2017-05-17 NOTE — Op Note (Signed)
NAME: Dyneisha Murchison    MEDICAL RECORD NO.: [id number]   FACILITY: [facility] OF BIRTH: 1981-04-01  PHYSICIAN: Pietro Cassis. Alvan Dame, M.D.    DATE OF PROCEDURE: 05/17/2017    OPERATIVE REPORT   PREOPERATIVE DIAGNOSIS: Right knee medial compartment osteoarthritis.   POSTOPERATIVE DIAGNOSIS: Right knee medial compartment osteoarthritis.  PROCEDURE: Right partial knee replacement utilizing Biomet Oxford knee  component, size X-small femur, a right medial size A tibial tray with a size 3 mm insert.   SURGEON: Pietro Cassis. Alvan Dame, M.D.   ASSISTANT: Danae Orleans, PAC.  Please note that Mr. Guinevere Scarlet was present for the entirety of the case,  utilized for preoperative positioning, perioperative retractor  management, general facilitation of the case and primary wound closure.   ANESTHESIA: General.   SPECIMENS: None.   COMPLICATIONS: None.  DRAINS: None   TOURNIQUET TIME: 33 minutes at 250 mmHg.   INDICATIONS FOR PROCEDURE: The patient is a 36 y.o. patient of mine who presented for evaluation of right knee pain.  They presented with primary complaints of pain on the medial side of their knee. Radiographs revealed advanced medial compartment arthritis with specifically an antero-medial wear pattern.  There was bone on bone changes noted with subchondral sclerosis and osteophytes present. The patient has had progressive problems failing to respond to conservative measures of medications, injections and activity modification. Risks of infection, DVT, component failure, need for future revision surgery were all discussed and reviewed.  Consent was obtained for benefit of pain relief.   PROCEDURE IN DETAIL: The patient was brought to the operative theater.  Once adequate anesthesia, preoperative antibiotics, 2gm Ancef, 1 gm of Tranexamic Acid, and 10 mg of Deacdron administered, the patient was positioned in supine position with a right thigh tourniquet  placed. The right lower extremity was  prepped and draped in sterile  fashion with the leg on the Oxford leg holder.  The leg was allowed to flex to 120 degrees. A time-out  was performed identifying the patient, planned procedure, and extremity.  The leg was exsanguinated, tourniquet elevated to 250 mmHg. A midline  incision was made from the proximal pole of the patella to the tibial tubercle. A  soft tissue plane was created and partial median arthrotomy was then  made to allow for subluxation of the patella. Following initial synovectomy and  debridement, the osteophytes were removed off the medial aspect of the  knee.   Attention was first directed to the tibia. The tibial  extramedullary guide was positioned over the anterior crest of the tibia  and pinned into position, and using a measured resection guide from the  Jenkins system, a 4 mm resection was made off the proximal tibia. First  the reciprocating saw along the medial aspect of the tibial spines, then the oscillating saw.    At this point, I sized this cut surface seem to be best fit for a size A tibial tray.  With the retractors out of the wound and the knee held at 90 degrees the 4 feeler gauge had appropriate tension on the medial ligament.   At this point, the femoral canal was opened with a drill and the  intramedullary rod passed. Then using the posterior cutting guide for a resection off  the posterior aspect of the femur was positioned over the mid portion of the medial femoral condyle.  The orientation was set using the guide that mates the femoral guide to the intramedullary rod.  The 2 drill holes were made  into the distal femur.  The posterior guide was then impacted into place and the posterior  femoral cut made.  At this point, I milled the distal femur with a size 4 spigot in place. At this point, we did a trial reduction of the X-small femur, size A tibial tray and the size 4 feeler gauge. At 90 degrees of the tension on the MCL was as expected  however at 20 degrees the size 3 feeler gauge had appropriate tension.  Given the difference in the tension between the knee in 90 degrees versus that in 20 degrees I had to place the 5 spigot into the femur and re-mill the distal femur.  Remaining bone was removed and debrided.  I repeated the trial reduction and found that now at both 90 degrees and 20 degrees the knee ligament were tension symmetrically with the 4 feeler gauge.  Given these findings, the trial femoral component was removed. Final preparation of tibia was carried out by pinning it in position. Then  using a reciprocating saw I removed bone for the keel. Further bone was  removed with an osteotome.  Trial reduction was now carried out with the X-small femur, the right medial keeled size A tibia, and the size 3 verus the 4 lollipop insert. The balance of the  ligaments appeared to be symmetric at 20 degrees and 90 degrees. Given  all these findings, the trial components were removed.   Cement was mixed. The final components were opened. The knee was irrigated with  normal saline solution. Then final debridements of the  soft tissue was carried out, I also drilled the sclerotic bone with a drill.  The final components were cemented with a single batch of cement in a  two-stage technique with the tibial component cemented first. The knee  was then brought  to 45 degrees of flexion with a 4 feeler gauge, held with pressure for a minute and half.  After this the femoral component was cemented in place.  The knee was again held at 45 degrees of flexion while the cement fully cured.  Excess cement was removed throughout the knee. Tourniquet was let down  after 33 minutes. After the cement had fully cured and excessive cement  was removed throughout the knee there was no visualized cement present.   The final right medial size 3 x-small insert was chosen and snapped into position. We re-irrigated  the knee. I placed a medium Hemovac  drain deep. The extensor mechanism  was then reapproximated using a #1 Vicryl and #0 Stratafix sutures with the knee in flexion. The  remaining wound was closed with 2-0 Vicryl and a running 4-0 Monocryl.  The knee was cleaned, dried, and dressed sterilely using Dermabond and  Aquacel dressing. The drain site was dressed separately. The patient  was brought to the recovery room, Ace wrap in place, tolerating the  procedure well. She will be in the hospital for overnight observation.  We will initiate physical therapy and progress to ambulate.     Pietro Cassis Alvan Dame, M.D.

## 2017-05-17 NOTE — Anesthesia Procedure Notes (Signed)
Anesthesia Regional Block: Adductor canal block   Pre-Anesthetic Checklist: ,, timeout performed, Correct Patient, Correct Site, Correct Laterality, Correct Procedure,, site marked, risks and benefits discussed, Surgical consent,  Pre-op evaluation,  At surgeon's request and post-op pain management  Laterality: Right  Prep: chloraprep       Needles:  Injection technique: Single-shot  Needle Type: Echogenic Stimulator Needle     Needle Length: 9cm  Needle Gauge: 21     Additional Needles:   Procedures:,,,, ultrasound used (permanent image in chart),,,,  Narrative:  Start time: 05/17/2017 10:30 AM End time: 05/17/2017 10:40 AM Injection made incrementally with aspirations every 5 mL.  Performed by: Personally  Anesthesiologist: Murvin Natal, MD  Additional Notes: Functioning IV was confirmed and monitors were applied.  A 7mm 21ga Arrow echogenic stimulator needle was used. Sterile prep, hand hygiene and sterile gloves were used.  Negative aspiration and negative test dose prior to incremental administration of local anesthetic. The patient tolerated the procedure well.

## 2017-05-17 NOTE — Discharge Instructions (Signed)

## 2017-05-17 NOTE — Brief Op Note (Signed)
05/17/2017  2:16 PM  PATIENT:  Vicki Yu  36 y.o. female  PRE-OPERATIVE DIAGNOSIS: #1.  Right knee medial compartment osteoarthritis and pain. 2.  Left knee lateral meniscal tear/internal derangement following previously performed medial and patellofemoral partial knee arthroplasty  POST-OPERATIVE DIAGNOSIS:   #1.  Right knee medial compartment osteoarthritis and pain. 2.  Left knee lateral meniscal tear/internal derangement following previously performed medial and patellofemoral partial knee arthroplasty 3.  Grade II-III chondromalacia lateral compartment but only affecting the lateral to plateau  4.  Anterior medial scarring with potential impingement within the previously replaced medial compartment  PROCEDURE: #1 right knee medial compartment partial knee arthroplasty (this operative note will be under a separate template). 2.  Left knee diagnostic and operative arthroscopy with partial lateral meniscectomy posterior horn mid body region, lateral chondroplasty, anterior medial scar debridement -dictated under this brief operative note.   SURGEON:  Surgeon(s) and Role:    * Paralee Cancel, MD - Primary  PHYSICIAN ASSISTANT: Danae Orleans, PA-C  ANESTHESIA:   general  EBL:  50 mL   BLOOD ADMINISTERED:none  DRAINS: none   LOCAL MEDICATIONS USED:  MARCAINE     SPECIMEN:  No Specimen  DISPOSITION OF SPECIMEN:  N/A  COUNTS:  YES  TOURNIQUET:   Total Tourniquet Time Documented: Thigh (Right) - 33 minutes Total: Thigh (Right) - 33 minutes   DICTATION: .Other Dictation: Dictation Number [id number]  PLAN OF CARE: Admit for overnight observation  PATIENT DISPOSITION:  PACU - hemodynamically stable.   Delay start of Pharmacological VTE agent (>24hrs) due to surgical blood loss or risk of bleeding: no

## 2017-05-17 NOTE — Anesthesia Preprocedure Evaluation (Addendum)
Anesthesia Evaluation  Patient identified by MRN, date of birth, ID band Patient awake    Reviewed: Allergy & Precautions, H&P , NPO status , Patient's Chart, lab work & pertinent test results  History of Anesthesia Complications (+) PONV and history of anesthetic complications  Airway Mallampati: II  TM Distance: >3 FB Neck ROM: Full    Dental no notable dental hx.    Pulmonary neg pulmonary ROS,    Pulmonary exam normal        Cardiovascular Exercise Tolerance: Good hypertension, Pt. on medications Normal cardiovascular exam  ECG: SR, rate 94   Neuro/Psych PSYCHIATRIC DISORDERS Anxiety Depression negative neurological ROS     GI/Hepatic Neg liver ROS, GERD  Medicated and Controlled,  Endo/Other  negative endocrine ROS  Renal/GU negative Renal ROS   Polycystic ovary disease    Musculoskeletal  (+) Fibromyalgia -Right knee medial compartmental osteoarthritis, left knee possible lateral meniscus tear   Abdominal (+) + obese,   Peds  Hematology  (+) anemia , Plt: 405   Anesthesia Other Findings   Reproductive/Obstetrics                          Anesthesia Physical  Anesthesia Plan  ASA: III  Anesthesia Plan: Spinal and Regional   Post-op Pain Management:  Regional for Post-op pain   Induction:   PONV Risk Score and Plan: 3 and Midazolam, Dexamethasone, Ondansetron, Treatment may vary due to age or medical condition and Scopolamine patch - Pre-op  Airway Management Planned: Natural Airway  Additional Equipment:   Intra-op Plan:   Post-operative Plan:   Informed Consent: I have reviewed the patients History and Physical, chart, labs and discussed the procedure including the risks, benefits and alternatives for the proposed anesthesia with the patient or authorized representative who has indicated his/her understanding and acceptance.     Plan Discussed with:  CRNA  Anesthesia Plan Comments:       Anesthesia Quick Evaluation

## 2017-05-17 NOTE — Interval H&P Note (Signed)
History and Physical Interval Note:  05/17/2017 10:44 AM  Vicki Yu  has presented today for surgery, with the diagnosis of Right knee medial compartmental osteoarthritis, left knee possible lateral meniscus tear  The various methods of treatment have been discussed with the patient and family. After consideration of risks, benefits and other options for treatment, the patient has consented to  Procedure(s) with comments: UNICOMPARTMENTAL KNEE-Medially (Right) - 120 MINS FOR BOTH PROCEDURES KNEE ARTHROSCOPY WITH DEBRIDEMENT (Left) as a surgical intervention .  The patient's history has been reviewed, patient examined, no change in status, stable for surgery.  I have reviewed the patient's chart and labs.  Questions were answered to the patient's satisfaction.     Mauri Pole

## 2017-05-17 NOTE — Transfer of Care (Signed)
Immediate Anesthesia Transfer of Care Note  Patient: Vicki Yu  Procedure(s) Performed: UNICOMPARTMENTAL KNEE-Medially (Right Knee) KNEE ARTHROSCOPY WITH DEBRIDEMENT (Left Knee)  Patient Location: PACU  Anesthesia Type:General and Regional  Level of Consciousness: awake, drowsy and patient cooperative  Airway & Oxygen Therapy: Patient Spontanous Breathing and Patient connected to face mask  Post-op Assessment: Report given to RN and Post -op Vital signs reviewed and stable  Post vital signs: Reviewed and stable  Last Vitals:  Vitals:   05/17/17 1049 05/17/17 1050  BP: 107/75   Pulse: 88 86  Resp: (!) 21 (!) 21  Temp:    SpO2: 98% 100%    Last Pain:  Vitals:   05/17/17 0906  TempSrc: Oral      Patients Stated Pain Goal: 4 (50/15/86 8257)  Complications: No apparent anesthesia complications

## 2017-05-18 DIAGNOSIS — M1711 Unilateral primary osteoarthritis, right knee: Secondary | ICD-10-CM | POA: Diagnosis not present

## 2017-05-18 LAB — BASIC METABOLIC PANEL
Anion gap: 7 (ref 5–15)
BUN: 11 mg/dL (ref 6–20)
CALCIUM: 8.3 mg/dL — AB (ref 8.9–10.3)
CO2: 26 mmol/L (ref 22–32)
CREATININE: 0.92 mg/dL (ref 0.44–1.00)
Chloride: 106 mmol/L (ref 101–111)
GFR calc non Af Amer: >60 mL/min (ref >60)
Glucose, Bld: 115 mg/dL — ABNORMAL HIGH (ref 65–99)
Potassium: 4.5 mmol/L (ref 3.5–5.1)
SODIUM: 139 mmol/L (ref 135–145)

## 2017-05-18 LAB — CBC
HCT: 30 % — ABNORMAL LOW (ref 36.0–46.0)
Hemoglobin: 9.5 g/dL — ABNORMAL LOW (ref 12.0–15.0)
MCH: 26.2 pg (ref 26.0–34.0)
MCHC: 31.7 g/dL (ref 30.0–36.0)
MCV: 82.6 fL (ref 78.0–100.0)
Platelets: 292 10*3/uL (ref 150–400)
RBC: 3.63 MIL/uL — ABNORMAL LOW (ref 3.87–5.11)
RDW: 13.9 % (ref 11.5–15.5)
WBC: 9.5 10*3/uL (ref 4.0–10.5)

## 2017-05-18 MED ORDER — PROMETHAZINE HCL 25 MG PO TABS: 12.5000 mg | ORAL_TABLET | Freq: Four times a day (QID) | ORAL | Status: DC | PRN

## 2017-05-18 NOTE — Op Note (Signed)
NAMEZAYNA, Vicki Yu             ACCOUNT NO.:  [id number]  MEDICAL RECORD NO.:  [id number]  LOCATION:  PERIO                        FACILITY:  [facility]  PHYSICIAN:  Pietro Cassis. Alvan Dame, M.D.  DATE OF BIRTH:  15-Aug-1980  DATE OF PROCEDURE:  05/17/2017 DATE OF DISCHARGE:                              OPERATIVE REPORT   POSTOPERATIVE DIAGNOSES: 1. Right knee medial-based osteoarthritis and pain. 2. History of left partial medial and patellofemoral arthroplasties     with both medial and lateral-based pain and MRI evidence of a     lateral meniscal tear.  POSTOPERATIVE DIAGNOSES: 1. Right knee medial compartment osteoarthritis and pain. 2. 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 3. Anterior medial-based scarring within the medial aspect of the left     knee components with scar.  PROCEDURE: 1. Right medial-based unicompartmental arthroplasty.  This operative     note was templated off on a separate note. 2. Left knee diagnostic and operative arthroscopy with lateral partial     meniscectomy. 3. Lateral compartment chondroplasty debriding the unstable flaps in     the tibial plateau surface laterally, but not significant. 4. Debridement of scar in the anterior medial and medial gutter of the     left knee eliminating the identified soft tissue impingement.  SURGEON:  Pietro Cassis. Alvan Dame, M.D.  ASSISTANT:  For the arthroscopic surgery, none.  ANESTHESIA:  General.  BLOOD LOSS:  50 mL for total.  COMPLICATIONS:  None.  INDICATIONS:  Vicki Yu is a 36 year old female with a history of unfortunately developing significant arthritis in her knees in early age.  She has undergone a partial medial and partial patellofemoral arthroplasties on her left knee.  She had had some increasing discomfort recently.  An MRI was ordered and revealed a lateral meniscal  tear. Based on the persistent mechanical symptoms and her age predominantly, I wished for her to trial conservatively an arthroscopic surgery on her knee again, so we could evaluate and manage the meniscus tear.  We reviewed the risk of persistent pain and need for future surgeries versus the benefit of minimalized surgery.  We discussed standard risks of infection, DVT in the setting of both these procedures.  Consent was obtained.  PROCEDURE IN DETAIL:  The patient was brought to the operative theater. Once adequate anesthesia, preoperative antibiotics, tranexamic acid, and Decadron were administered, she was positioned supine.  I positioned her right leg for the right partial knee arthroplasty in the Oxford leg holder as would be templated later.  Her left leg was placed into the arthroscopic leg holder.  Both lower extremities were then prepped and draped in sterile fashion as I would typically drape them out for each procedure.  A time-out was performed identifying the patient, planned procedure, and extremity.  The focus first was on the left knee. Standard inferior medial and inferior lateral portals were utilized. Diagnostic evaluation of the knee revealed a posterior horn lateral meniscal tear into the midbody region.  I used a combination of the inferior lateral portal and switching portals from the medial  side to perform lateral meniscectomy using the straight-biting basket and 3.5 Cuda shaver.  The 3.5 Cuda shaver was then inserted to remove the meniscal fragments, blending and contouring the remaining meniscus back to a stable level.  I additionally debrided some of the tibial plateau chondral flaps.  There was no evidence of exposed bone in this area.  Once I was satisfied with the overall debridement and stabilization of meniscal and articular cartilages laterally, I then addressed medially based on her complaints.  There was some scar tissue over this medial side of  the joint that I was able to visually debride and open up.  Some of this did have some hint of it impinging into the joint space.  Once this was debrided, I did not try to go up into the anterior compartments.  I did not want to cause any damage or trauma to the trochlear plate that was there.  I reviewed the procedures that were performed, making certain that there were no other issues inside the knee.  Then, instrumentation was removed.  The knee portal sites were reapproximated using 4-0 nylon. The knee was then injected at the end of the case with 0.25% Marcaine and Toradol, which was half of a mixture that I used for the right knee as well.  The knee was then cleaned, dried, and dressed sterilely including the bulky wrap and Ace wrap.  At the conclusion of this procedure, I dressed the right knee.  Again, the right knee procedure is under a separate operative note templated in the computer.     Pietro Cassis Alvan Dame, M.D.     MDO/MEDQ  D:  05/17/2017  T:  05/18/2017  Job:  [id number]

## 2017-05-18 NOTE — Progress Notes (Signed)
Patient ID: Vicki Yu, female   DOB: 1980/08/10, 36 y.o.   MRN: [id number] Subjective: 1 Day Post-Op Procedure(s) (LRB): UNICOMPARTMENTAL KNEE-Medially (Right) KNEE ARTHROSCOPY WITH DEBRIDEMENT (Left)    Patient reports pain as mild to moderate.  Received dose of Dilaudid last night but otherwise doing well, she states better than previous surgeries. No events.  Did sit at side of bed last night  Objective:   VITALS:   Vitals:   05/18/17 0109 05/18/17 0612  BP: 107/70 (!) 99/56  Pulse: 94 82  Resp: 18 17  Temp: 98.7 F (37.1 C) 98.7 F (37.1 C)  SpO2: 96% 98%    Neurovascular intact Incision: dressing C/D/I - bilaterally  LABS Recent Labs    05/18/17 0506  HGB 9.5*  HCT 30.0*  WBC 9.5  PLT 292    Recent Labs    05/18/17 0506  NA 139  K 4.5  BUN 11  CREATININE 0.92  GLUCOSE 115*    No results for input(s): LABPT, INR in the last 72 hours.   Assessment/Plan: 1 Day Post-Op Procedure(s) (LRB): UNICOMPARTMENTAL KNEE-Medially (Right) KNEE ARTHROSCOPY WITH DEBRIDEMENT (Left)   Advance diet Up with therapy  Plan for discharge home today with HHPT RTC in 2 weeks Reviewed goals Prefers Zanaflex as muscle relaxant/pain control

## 2017-05-18 NOTE — Progress Notes (Signed)
Discharge planning, spoke with patient at beside. Vicki Yu for [facility] services, contacted [facility] for referral. Has RW and 3-n-1. [phone]

## 2017-05-18 NOTE — Progress Notes (Signed)
OT Cancellation Note  Patient Details Name: Bridgette Wolden MRN: [id number] DOB: [birth year]   Cancelled Treatment:    Reason Eval/Treat Not Completed: OT screened, no needs identified, will sign off  [place], Thereasa Parkin 05/18/2017, 11:47 AM

## 2017-05-18 NOTE — Addendum Note (Signed)
Addendum  created 05/18/17 4801 by Lollie Sails, CRNA   Charge Capture section accepted

## 2017-05-18 NOTE — Discharge Summary (Signed)
Physician Discharge Summary  Patient ID: Vicki Yu MRN: [id number] DOB/AGE: 1981-04-27 36 y.o.  Admit date: 05/17/2017 Discharge date:  05/18/2017  Procedures:  Procedure(s) (LRB): UNICOMPARTMENTAL KNEE-Medially (Right) KNEE ARTHROSCOPY WITH DEBRIDEMENT (Left)  Attending Physician:  Dr. Paralee Cancel   Admission Diagnoses:         1. Right knee medial compartmental primary OA /pain                                      2. Left knee possible lateral meniscal tear  Discharge Diagnoses:  Active Problems:   S/P right unicompartmental knee replacement  Past Medical History:  Diagnosis Date  . Anemia    hx of   . Anxiety   . Cancer ([place])    ovarian cancer 2015   . Depression   . Fibromyalgia   . GERD (gastroesophageal reflux disease)   . Hypertension   . Pneumonia    hx of  . Polycystic ovary disease    LMP 04/03/11  states takes Metformin for this  . PONV (postoperative nausea and vomiting)    facial flushing with body itching with anesthesia- starts then with nausea    HPI:    Vicki Yu, 36 y.o. female , has a history of pain and functional disability in the bilateral and has failed non-surgical conservative treatments for greater than 12 weeks to include NSAID's and/or analgesics, corticosteriod injections, viscosupplementation injections and activity modification.  Onset of symptoms was gradual, starting ~1 years ago with rapidlly worsening course since that time in the right knee and pain for about 6 months in the left knee. The patient noted prior procedures on the knee to include  arthroscopy on the right knee and a left medial UKR.  Patient currently rates pain in the bilateral knee(s) at 5 out of 10 with activity. Patient has night pain, worsening of pain with activity and weight bearing, pain that interferes with activities of daily living, pain with passive range of motion, crepitus and joint swelling.  Patient has evidence of periarticular osteophytes and  joint space narrowing of the medial compartment by imaging studies.  There is no active infection.  Risks, benefits and expectations were discussed with the patient.  Risks including but not limited to the risk of anesthesia, blood clots, nerve damage, blood vessel damage, failure of the prosthesis, infection and up to and including death.  Patient understand the risks, benefits and expectations and wishes to proceed with surgery.   PCP: Gilford Silvius, MD   Discharged Condition: good  Hospital Course:  Patient underwent the above stated procedure on 05/17/2017. Patient tolerated the procedure well and brought to the recovery room in good condition and subsequently to the floor.  POD #1 BP: 99/56 ; Pulse: 82 ; Temp: 98.7 F (37.1 C) ; Resp: 17 Patient reports pain as mild to moderate.  Received dose of Dilaudid last night but otherwise doing well, she states better than previous surgeries. No events.  Did sit at side of bed last night. Neurovascular intact and incision: dressing C/D/I - bilaterally.   LABS  Basename    HGB     9.5  HCT     30.0    Discharge Exam: General appearance: alert, cooperative and no distress Extremities: Homans sign is negative, no sign of DVT, no edema, redness or tenderness in the calves or thighs and no ulcers, gangrene or trophic changes  Disposition: Home with follow up in 2 weeks   Follow-up Information    Paralee Cancel, MD. Schedule an appointment as soon as possible for a visit in 2 week(s).   Specialty:  Orthopedic Surgery Contact information: [address] [place] [address] [phone]        [facility] Follow up.   Specialty:  Home Health Services Why:  physical therapy Contact information: [place]  [address] [phone]           Discharge Instructions    Call MD / Call 911   Complete by:  As directed    If you experience chest pain or shortness of  breath, CALL 911 and be transported to the hospital emergency room.  If you develope a fever above 101 F, pus (white drainage) or increased drainage or redness at the wound, or calf pain, call your surgeon's office.   Constipation Prevention   Complete by:  As directed    Drink plenty of fluids.  Prune juice may be helpful.  You may use a stool softener, such as Colace (over the counter) 100 mg twice a day.  Use MiraLax (over the counter) for constipation as needed.   Diet - low sodium heart healthy   Complete by:  As directed    Discharge instructions   Complete by:  As directed    RIGHT KNEE:  Maintain surgical dressing until follow up in the clinic. If the edges start to pull up, may reinforce with tape. If the dressing is no longer working, may remove and cover with gauze and tape, but must keep the area dry and clean.   LEFT KNEE:  Maintain surgical dressing until 05/19/2017 then replace with Band-Aids. Keep the area dry and clean.     Follow up in 2 weeks at [facility]. Call with any questions or concerns.   Increase activity slowly as tolerated   Complete by:  As directed    Weight bearing as tolerated with assist device (walker, cane, etc) as directed, use it as long as suggested by your surgeon or therapist, typically at least 4-6 weeks.   TED hose   Complete by:  As directed    Use stockings (TED hose) for 2 weeks on both leg(s).  You may remove them at night for sleeping.      Allergies as of 05/18/2017      Reactions   Levofloxacin Other (See Comments)   Joint Pain    Vilazodone Hcl Other (See Comments)   unusual thoughts   Penicillins Other (See Comments)   Pt does not remember Has patient had a PCN reaction causing immediate rash, facial/tongue/throat swelling, SOB or lightheadedness with hypotension: Unknown Has patient had a PCN reaction causing severe rash involving mucus membranes or skin necrosis: Unknown Has patient had a PCN reaction that required  hospitalization: Unknown Has patient had a PCN reaction occurring within the last 10 years: No If all of the above answers are "NO", then may proceed with Cephalosporin use.      Medication List    STOP taking these medications   acetaminophen 500 MG tablet Commonly known as:  TYLENOL   diclofenac sodium 1 % Gel Commonly known as:  VOLTAREN   oxyCODONE-acetaminophen 10-325 MG tablet Commonly known as:  PERCOCET   rivaroxaban 10 MG Tabs tablet Commonly known as:  XARELTO     TAKE these medications   amLODipine 2.5 MG tablet  Commonly known as:  NORVASC Take 5 mg by mouth daily.   aspirin 81 MG chewable tablet Commonly known as:  ASPIRIN CHILDRENS Chew 1 tablet (81 mg total) by mouth 2 (two) times daily.   cetirizine 10 MG tablet Commonly known as:  ZYRTEC Take 10 mg by mouth daily.   citalopram 40 MG tablet Commonly known as:  CELEXA Take 40 mg by mouth daily.   dicyclomine 10 MG capsule Commonly known as:  BENTYL Take 1 capsule (10 mg total) by mouth 4 (four) times daily as needed for spasms.   docusate sodium 100 MG capsule Commonly known as:  COLACE Take 1 capsule (100 mg total) by mouth 2 (two) times daily.   ferrous sulfate 325 (65 FE) MG tablet Commonly known as:  FERROUSUL Take 1 tablet (325 mg total) by mouth 3 (three) times daily with meals.   gabapentin 600 MG tablet Commonly known as:  NEURONTIN Take 900 mg by mouth 2 (two) times daily. Takes 1.5 tablets twice daily   guaiFENesin 600 MG 12 hr tablet Commonly known as:  MUCINEX Take 600 mg by mouth 2 (two) times daily.   HYDROcodone-acetaminophen 7.5-325 MG tablet Commonly known as:  NORCO Take 1-2 tablets by mouth every 4 (four) hours as needed for moderate pain or severe pain.   lamoTRIgine 100 MG tablet Commonly known as:  LAMICTAL Take 100 mg by mouth at bedtime.   methylphenidate 20 MG CR capsule Commonly known as:  METADATE CD Take 20 mg by mouth daily.   pantoprazole 40 MG  tablet Commonly known as:  PROTONIX Take 40 mg by mouth 2 (two) times daily.   polyethylene glycol packet Commonly known as:  MIRALAX / GLYCOLAX Take 17 g by mouth 2 (two) times daily.   pramipexole 0.5 MG tablet Commonly known as:  MIRAPEX Take 1 mg by mouth 2 (two) times daily.   promethazine 25 MG tablet Commonly known as:  PHENERGAN Take 12.5-25 mg by mouth every 6 (six) hours as needed for nausea or vomiting.   tiZANidine 4 MG tablet Commonly known as:  ZANAFLEX Take 1 tablet (4 mg total) by mouth every 6 (six) hours as needed for muscle spasms.   traZODone 100 MG tablet Commonly known as:  DESYREL Take 100 mg by mouth at bedtime as needed.        Signed: [place]. Burk Hoctor   PA-C  05/18/2017, 12:37 PM

## 2017-05-18 NOTE — Progress Notes (Signed)
Physical Therapy Treatment Patient Details Name: Vicki Yu MRN: [id number] DOB: 11-09-1980 Today's Date: 05/18/2017    History of Present Illness Pt is a 36 year old female s/p R unicompartmental knee arthroplasty and L knee arthroscopy with debridement.      PT Comments    Pt performed LE exercises and reviewed HEP handout provided.  Pt also ambulated again in hallway and reports feeling ready for d/c home today.  Pt had no further questions.  Follow Up Recommendations  Home health PT     Equipment Recommendations  None recommended by PT    Recommendations for Other Services       Precautions / Restrictions Precautions Precautions: Knee Restrictions Other Position/Activity Restrictions: WBAT bilaterally    Mobility  Bed Mobility Overal bed mobility: Modified Independent                Transfers Overall transfer level: Needs assistance Equipment used: Rolling walker (2 wheeled) Transfers: Sit to/from Stand Sit to Stand: Supervision         General transfer comment: verbal cues for hand placement  Ambulation/Gait Ambulation/Gait assistance: Min guard Ambulation Distance (Feet): 80 Feet Assistive device: Rolling walker (2 wheeled) Gait Pattern/deviations: Step-to pattern;Decreased stance time - right;Antalgic     General Gait Details: verbal cues for RW positioning, step length   Stairs            Wheelchair Mobility    Modified Rankin (Stroke Patients Only)       Balance                                            Cognition Arousal/Alertness: Awake/alert Behavior During Therapy: WFL for tasks assessed/performed Overall Cognitive Status: Within Functional Limits for tasks assessed                                        Exercises Total Joint Exercises Ankle Circles/Pumps: AROM;Both;10 reps Quad Sets: AROM;Both;10 reps Short Arc QuadSinclair Ship;Right;10 reps Heel Slides: AAROM;Right;10 reps Hip  ABduction/ADduction: AAROM;Right;10 reps Straight Leg Raises: AAROM;Right;10 reps    General Comments        Pertinent Vitals/Pain Pain Assessment: 0-10 Pain Score: 4  Pain Location: R knee with ambulation Pain Descriptors / Indicators: Aching;Sore Pain Intervention(s): Repositioned;Limited activity within patient's tolerance;Monitored during session;Ice applied    Home Living                      Prior Function            PT Goals (current goals can now be found in the care plan section) Progress towards PT goals: Progressing toward goals    Frequency    7X/week      PT Plan Current plan remains appropriate    Co-evaluation              AM-PAC PT "6 Clicks" Daily Activity  Outcome Measure  Difficulty turning over in bed (including adjusting bedclothes, sheets and blankets)?: None Difficulty moving from lying on back to sitting on the side of the bed? : None Difficulty sitting down on and standing up from a chair with arms (e.g., wheelchair, bedside commode, etc,.)?: None Help needed moving to and from a bed to chair (including a wheelchair)?: A Little Help needed walking  in hospital room?: A Little Help needed climbing 3-5 steps with a railing? : A Little 6 Click Score: 21    End of Session   Activity Tolerance: Patient tolerated treatment well Patient left: in bed;with call bell/phone within reach;with family/visitor present   PT Visit Diagnosis: Other abnormalities of gait and mobility (R26.89)     Time: 6004-5997 PT Time Calculation (min) (ACUTE ONLY): 13 min  Charges:  $Therapeutic Exercise: 8-22 mins                    G Codes:       Carmelia Bake, PT, DPT 05/18/2017 Pager: [phone]  York Ram E 05/18/2017, 4:05 PM

## 2017-05-18 NOTE — Evaluation (Signed)
Physical Therapy Evaluation Patient Details Name: Vicki Yu MRN: [id number] DOB: [birth year] Today's Date: 05/18/2017   History of Present Illness  Pt is a 36 year old female s/p R unicompartmental knee arthroplasty and L knee arthroscopy with debridement.    Clinical Impression  Patient is s/p above surgery resulting in functional limitations due to the deficits listed below (see PT Problem List).  Patient will benefit from skilled PT to increase their independence and safety with mobility to allow discharge to the venue listed below.  Pt ambulated in hallway and practiced steps.  Pt requested PT return this afternoon to review exercises and then pt likely to d/c home.     Follow Up Recommendations Home health PT    Equipment Recommendations  None recommended by PT    Recommendations for Other Services       Precautions / Restrictions Precautions Precautions: Knee Restrictions Weight Bearing Restrictions: No Other Position/Activity Restrictions: WBAT bilaterally      Mobility  Bed Mobility Overal bed mobility: Modified Independent                Transfers Overall transfer level: Needs assistance Equipment used: Rolling walker (2 wheeled) Transfers: Sit to/from Stand Sit to Stand: Min guard         General transfer comment: min/guard for safety, verbal cues for hand placement  Ambulation/Gait Ambulation/Gait assistance: Min guard Ambulation Distance (Feet): 160 Feet Assistive device: Rolling walker (2 wheeled) Gait Pattern/deviations: Step-to pattern;Decreased stance time - right;Antalgic     General Gait Details: verbal cues for sequence, RW positioning, step length  Stairs Stairs: Yes Stairs assistance: Min guard Stair Management: Step to pattern;Forwards;Sideways;Two rails Number of Stairs: 2 General stair comments: pt performed once forwards with both rails step to pattern and then again with right rail step to pattern semisideways (states she  was performing them this way prior to surgery), no physical assist required.  Wheelchair Mobility    Modified Rankin (Stroke Patients Only)       Balance                                             Pertinent Vitals/Pain Pain Assessment: 0-10 Pain Score: 5  Pain Location: R knee with ambulation Pain Descriptors / Indicators: Aching;Sore Pain Intervention(s): Limited activity within patient's tolerance;Repositioned;Monitored during session;Premedicated before session    [place] expects to be discharged to:: Private residence Living Arrangements: Spouse/significant other;Children   Type of Home: House Home Access: Stairs to enter Entrance Stairs-Rails: Psychiatric nurse of Steps: 4 Home Layout: One level Home Equipment: Environmental consultant - 2 wheels;Bedside commode      Prior Function Level of Independence: Independent               Hand Dominance        Extremity/Trunk Assessment        Lower Extremity Assessment Lower Extremity Assessment: RLE deficits/detail RLE Deficits / Details: observed at least 60* R knee AAROM functionally with donning shoes       Communication   Communication: No difficulties  Cognition Arousal/Alertness: Awake/alert Behavior During Therapy: WFL for tasks assessed/performed Overall Cognitive Status: Within Functional Limits for tasks assessed  General Comments      Exercises     Assessment/Plan    PT Assessment Patient needs continued PT services  PT Problem List Decreased strength;Decreased mobility;Decreased range of motion;Pain       PT Treatment Interventions Stair training;Gait training;Therapeutic exercise;DME instruction;Therapeutic activities;Functional mobility training;Patient/family education    PT Goals (Current goals can be found in the Care Plan section)  Acute Rehab PT Goals PT Goal Formulation: With  patient Time For Goal Achievement: 05/22/17 Potential to Achieve Goals: Good    Frequency 7X/week   Barriers to discharge        Co-evaluation               AM-PAC PT "6 Clicks" Daily Activity  Outcome Measure Difficulty turning over in bed (including adjusting bedclothes, sheets and blankets)?: None Difficulty moving from lying on back to sitting on the side of the bed? : None Difficulty sitting down on and standing up from a chair with arms (e.g., wheelchair, bedside commode, etc,.)?: None Help needed moving to and from a bed to chair (including a wheelchair)?: A Little Help needed walking in hospital room?: A Little Help needed climbing 3-5 steps with a railing? : A Little 6 Click Score: 21    End of Session   Activity Tolerance: Patient tolerated treatment well Patient left: in bed;with call bell/phone within reach   PT Visit Diagnosis: Other abnormalities of gait and mobility (R26.89)    Time: 3748-2707 PT Time Calculation (min) (ACUTE ONLY): 13 min   Charges:   PT Evaluation $PT Eval Low Complexity: 1 Low     PT G Codes:   PT G-Codes **NOT FOR INPATIENT CLASS** Functional Assessment Tool Used: AM-PAC 6 Clicks Basic Mobility;Clinical judgement Functional Limitation: Mobility: Walking and moving around Mobility: Walking and Moving Around Current Status (E6754): At least 20 percent but less than 40 percent impaired, limited or restricted Mobility: Walking and Moving Around Goal Status [phone]): At least 1 percent but less than 20 percent impaired, limited or restricted   Carmelia Bake, PT, DPT 05/18/2017 Pager: [phone]   York Ram E 05/18/2017, 11:45 AM

## 2018-11-19 IMAGING — MR MR KNEE*R* W/O CM
6 series · 40 of 40 positions shown · non-contrast
Comparison: None.

CLINICAL DATA: 36-year-old with right knee pain for years. Surgery
2 years ago. History of metastatic ovarian cancer.

EXAM:
MRI OF THE RIGHT KNEE WITHOUT CONTRAST
TECHNIQUE: Multiplanar, multisequence MR imaging of the knee was performed. No
intravenous contrast was administered.

[Series 3: PD · axial · 4.0mm · 0.50mm/px · z∈[-42,+68]mm · 8 of 24 slices shown (1 of 2)]
[im 1/24]
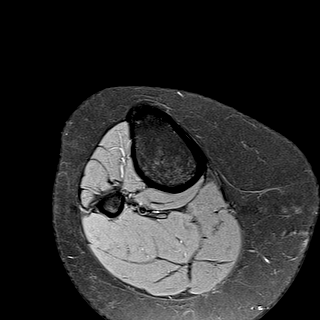
[im 4/24]
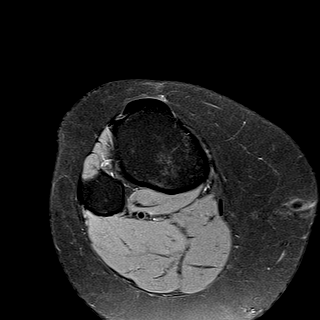
[im 7/24]
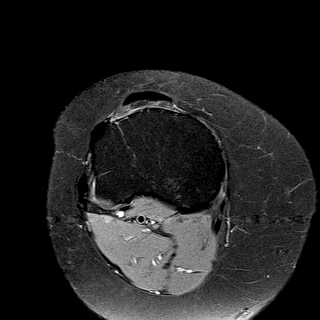
[im 10/24]
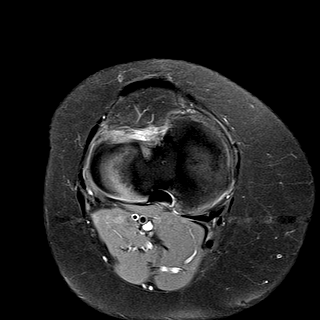
[im 14/24]
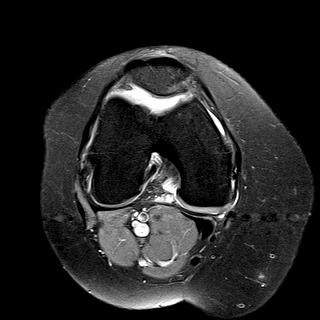
[im 17/24]
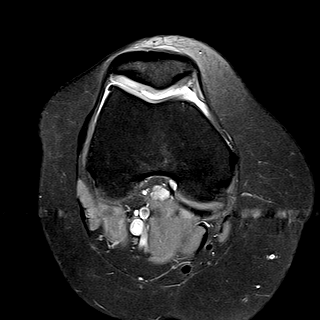
[im 20/24]
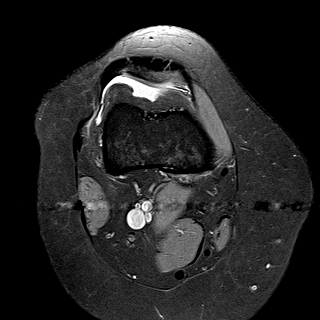
[im 24/24]
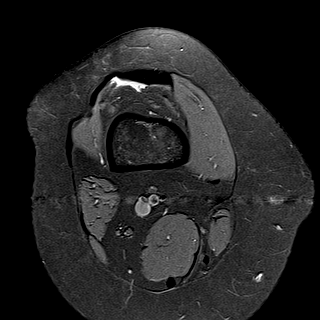

[Series 4: PD · coronal · 4.0mm · 0.53mm/px · 7 of 20 slices shown (2 of 2)]
[im 1/20]
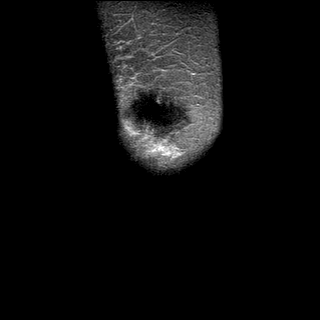
[im 4/20]
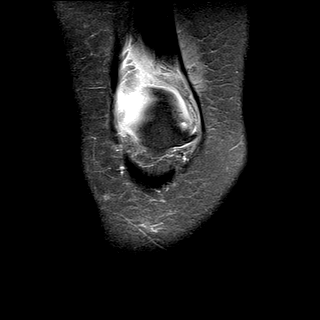
[im 7/20]
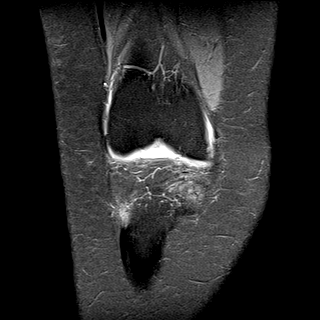
[im 10/20]
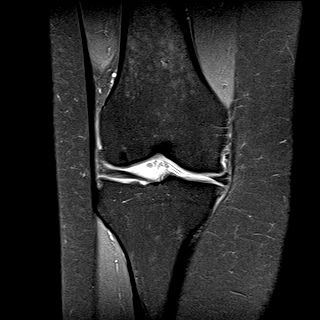
[im 13/20]
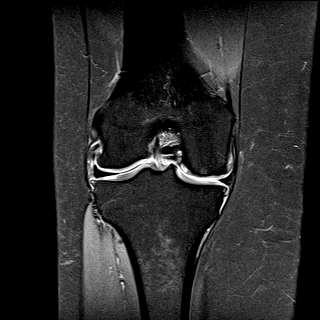
[im 16/20]
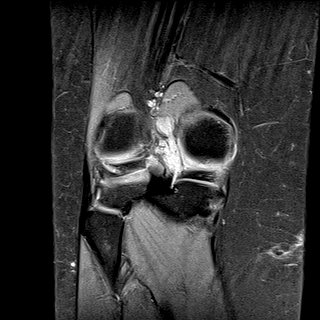
[im 20/20]
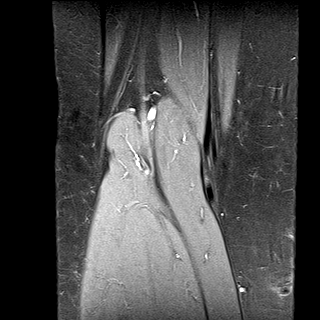

[Series 5: PD fat-sat · sagittal · 4.0mm · 0.50mm/px · 7 of 19 slices shown]
[im 1/19]
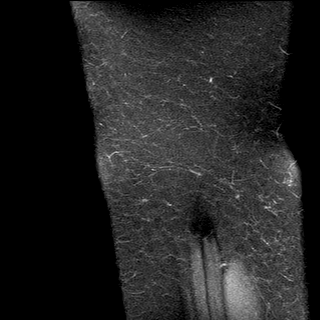
[im 4/19]
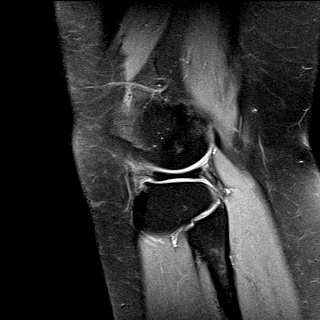
[im 7/19]
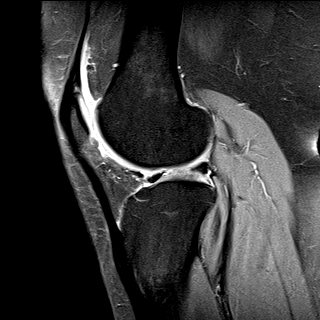
[im 10/19]
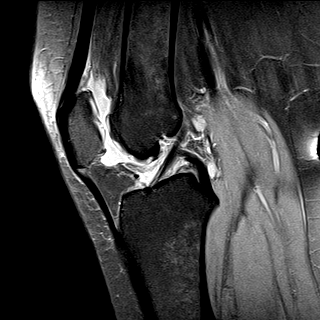
[im 13/19]
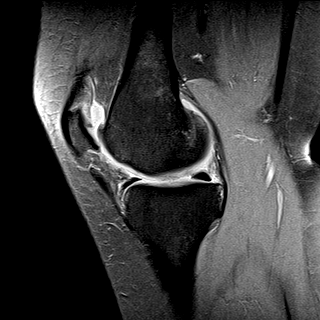
[im 16/19]
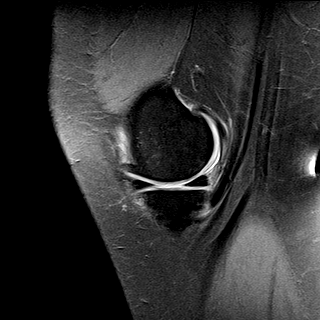
[im 19/19]
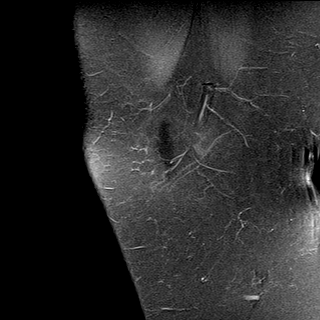

[Series 6: T1 · coronal · 4.0mm · 0.53mm/px · 7 of 20 slices shown]
[im 1/20]
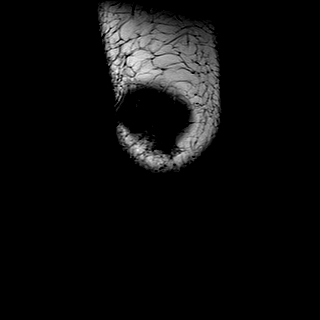
[im 4/20]
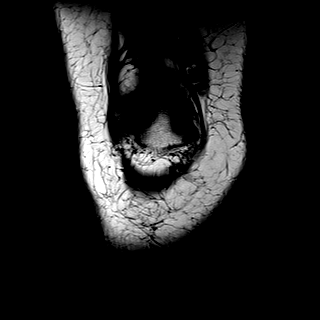
[im 7/20]
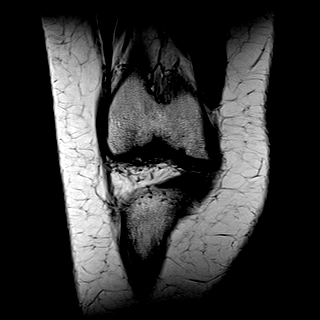
[im 10/20]
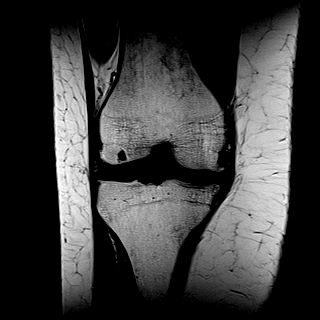
[im 13/20]
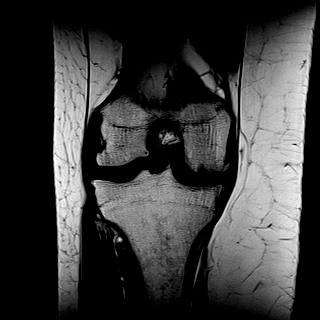
[im 16/20]
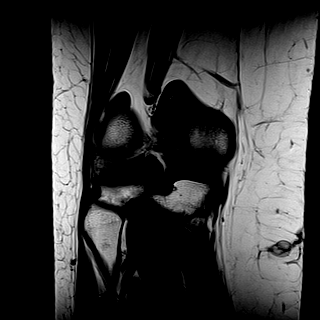
[im 20/20]
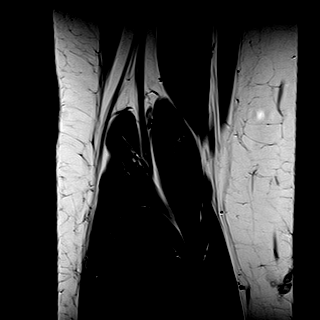

[Series 7: (id) fs · coronal · 4.0mm · 0.53mm/px · 7 of 20 slices shown]
[im 1/20]
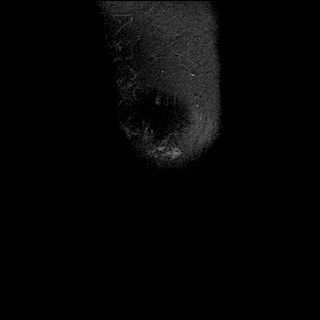
[im 4/20]
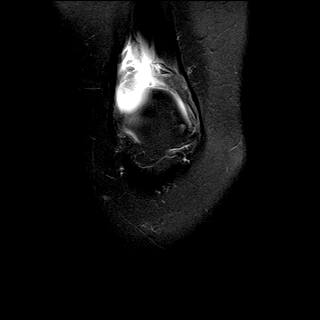
[im 7/20]
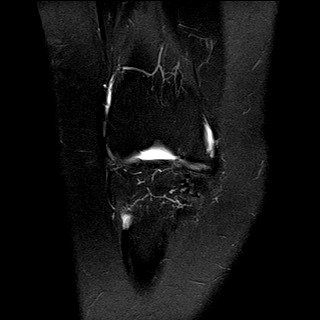
[im 10/20]
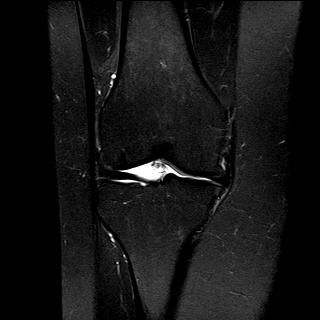
[im 13/20]
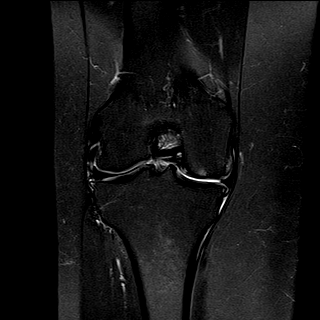
[im 16/20]
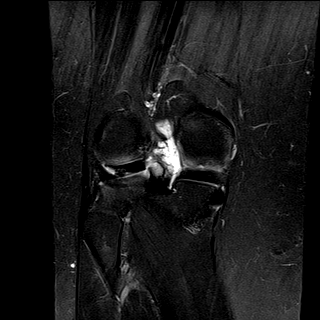
[im 20/20]
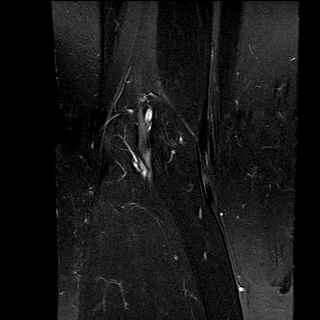

[Series 8: cor acl 2mm · coronal · 2.0mm · 0.59mm/px · 4 of 13 slices shown]
[im 1/13]
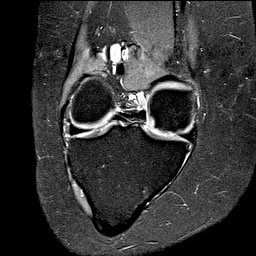
[im 5/13]
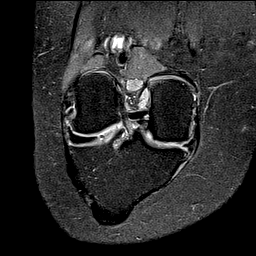
[im 9/13]
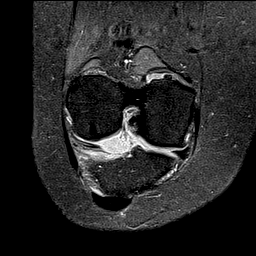
[im 13/13]
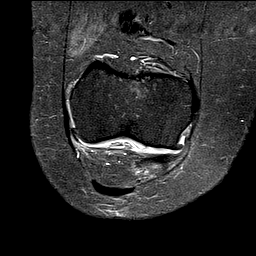

[40 of 40 positions shown; findings below may reference images not displayed]

FINDINGS: MENISCI

Medial meniscus: Mild free edge degenerative fraying. No meniscal
tear or displaced meniscal fragment. The meniscal root is intact.

Lateral meniscus:  Intact with normal morphology.

LIGAMENTS

Cruciates:  Intact.

Collaterals:  Intact.  Mild degenerative medial buckling of the MCL.

CARTILAGE

Patellofemoral: Mild central patellar chondral thinning and surface
irregularity. No full-thickness chondral defect. Mild trochlear
chondromalacia.

Medial: Moderate chondral thinning and surface irregularity with
osteophytes and mild subchondral cyst formation centrally in the
femoral condyle.

Lateral:  Preserved.

MISCELLANEOUS

Joint:  Small joint effusion.

Popliteal Fossa: Small synovial cysts extending from the
intercondylar notch around the PCL. No typical Baker's cyst.

Extensor Mechanism:  Intact.

Bones:  No acute or significant extra-articular osseous findings.

Other: No obvious postsurgical findings. Possible fat necrosis
medially in the proximal lower leg, only visualized in the coronal
plane.
IMPRESSION: 1. No obvious postsurgical changes in the right knee.
2. Moderate medial compartment osteoarthritis for age with mild free
edge fraying of the medial meniscus. No evidence of meniscal tear.
3. The lateral meniscus, cruciate and collateral ligaments are
intact.
4. Mild patellofemoral chondromalacia.

## 2019-01-04 IMAGING — DX DG CHEST 2V
2 series · 2 of 2 positions shown · non-contrast
Comparison: None.

CLINICAL DATA: Preoperative evaluation.  Hypertension.

EXAM:
CHEST  2 VIEW

[chest pa]
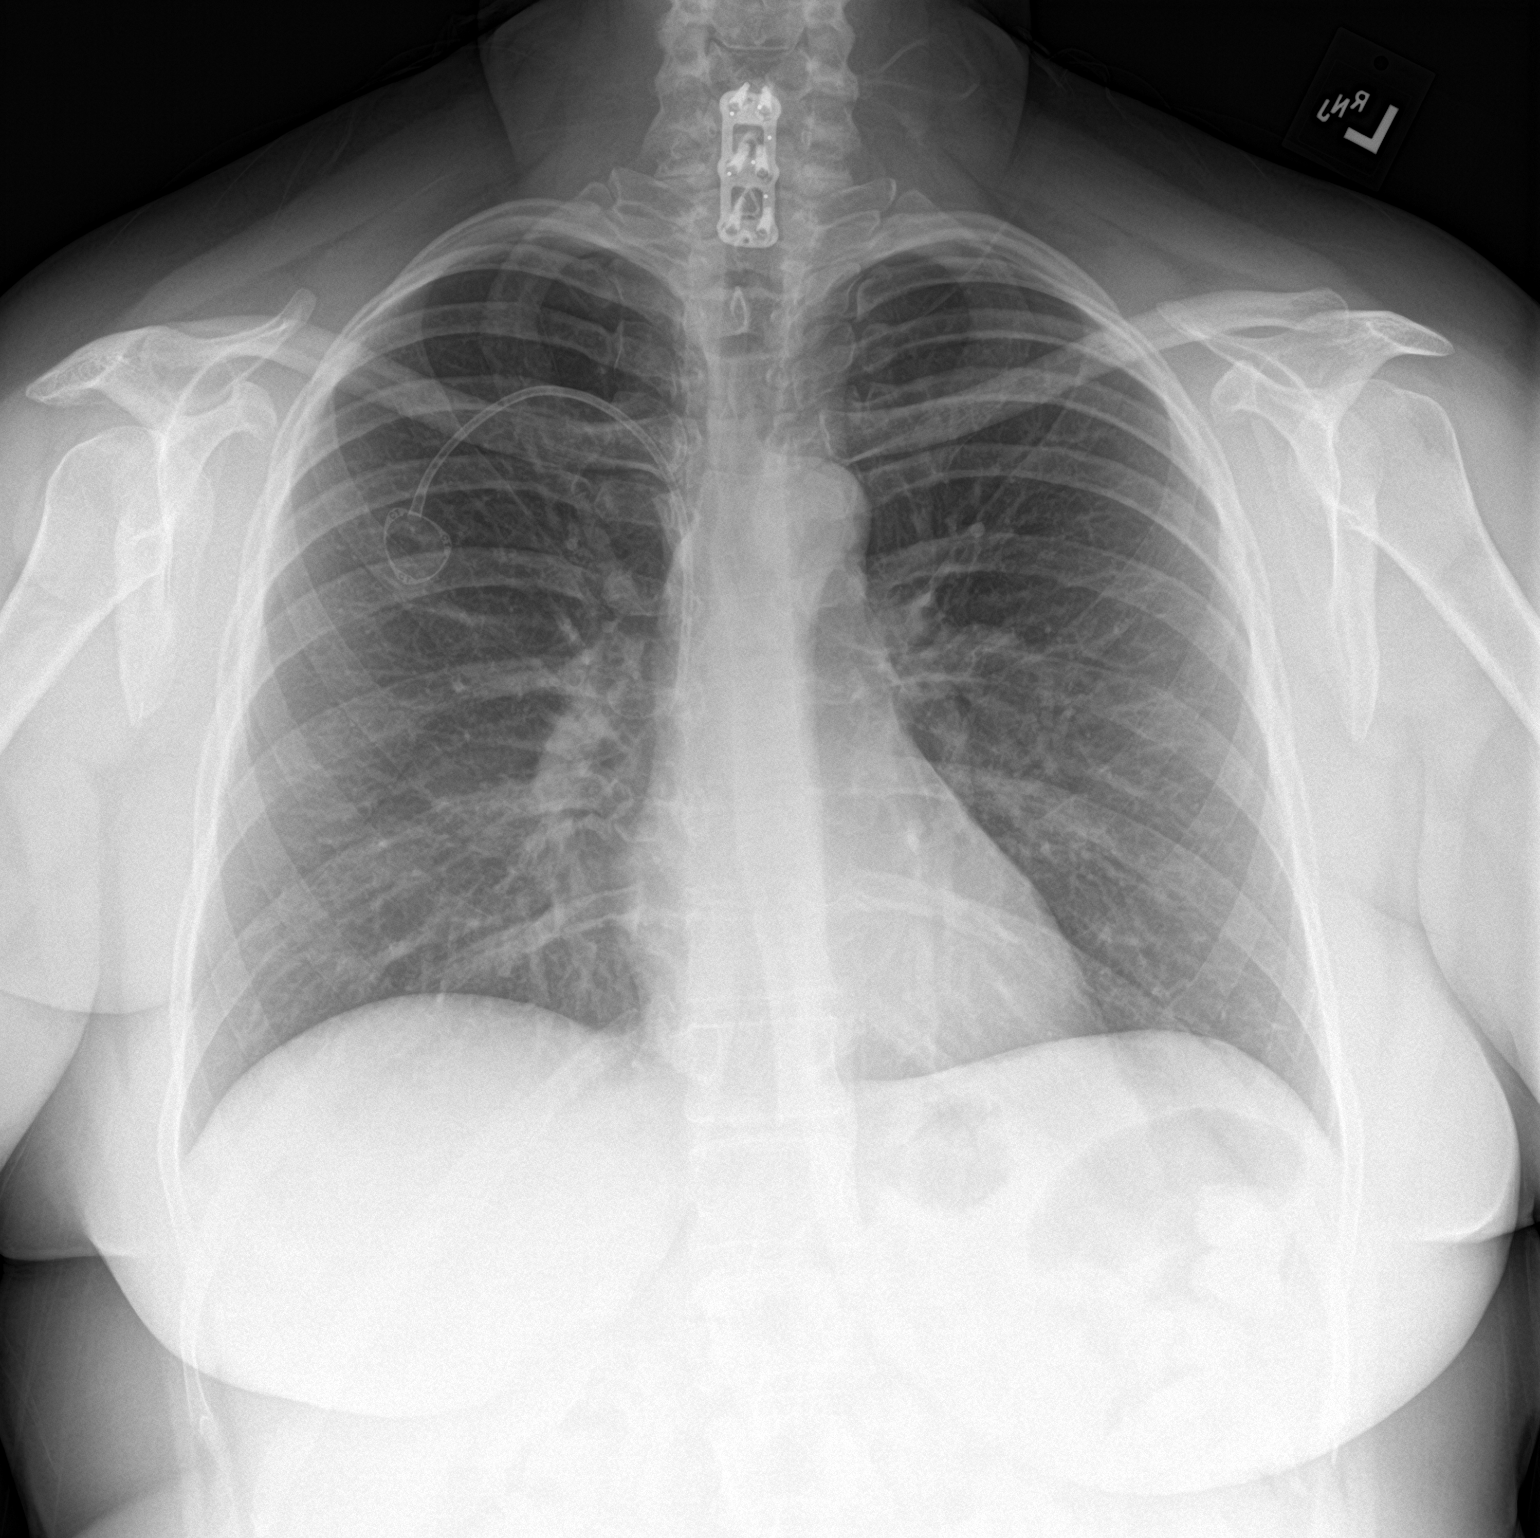

[chest lat]
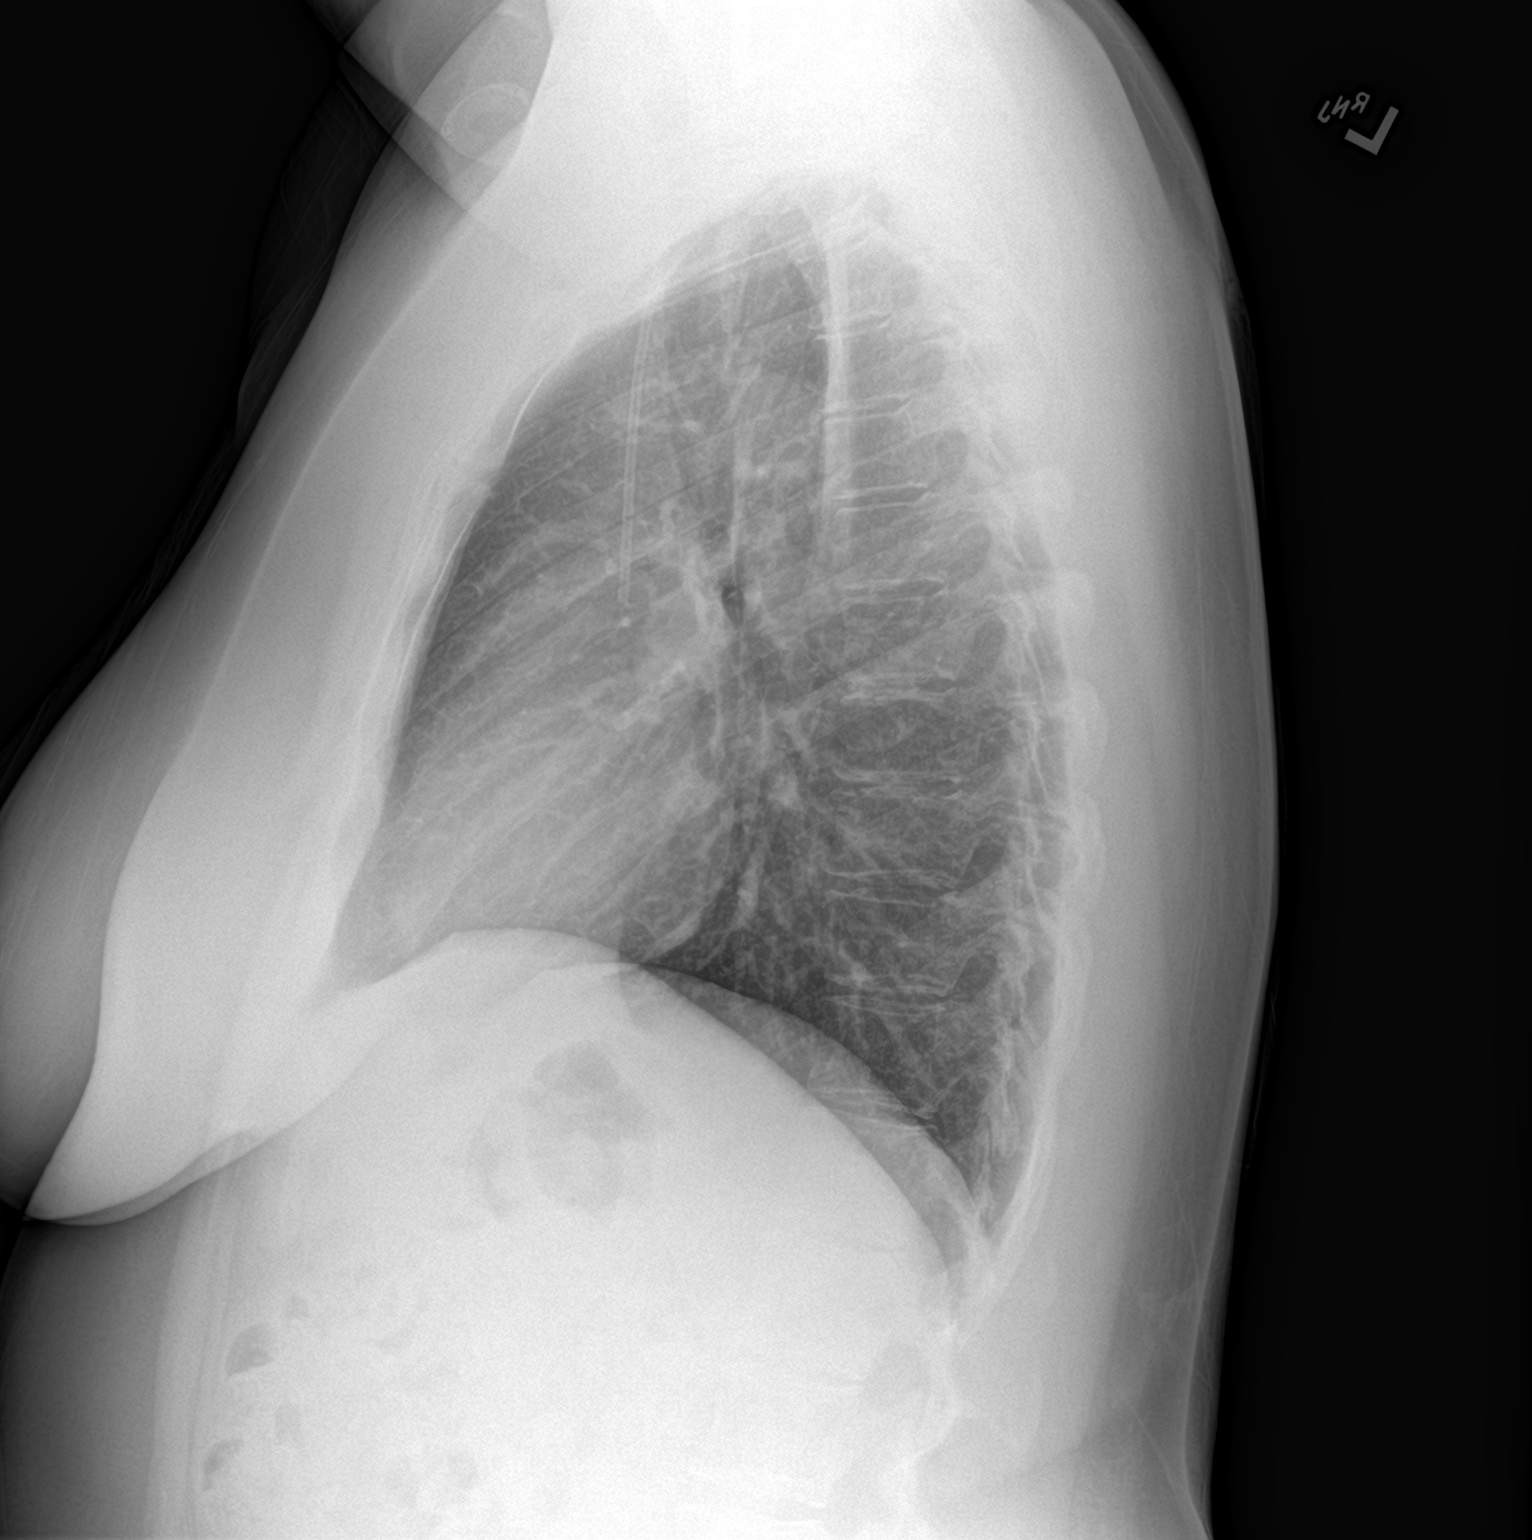

[2 of 2 positions shown; findings below may reference images not displayed]

FINDINGS: Port-A-Cath tip is in the superior vena cava. No pneumothorax. Lungs
are clear. Heart size and pulmonary vascularity are normal. No
adenopathy. There is postoperative change in the lower cervical
spine.
IMPRESSION: No edema or consolidation. Port-A-Cath tip in superior vena cava.
Heart size normal.

## 2019-12-28 ENCOUNTER — Ambulatory Visit: Admit: 2019-12-28 | Payer: BC Managed Care – PPO | Admitting: Orthopedic Surgery

## 2019-12-28 SURGERY — CONVERSION, ARTHROPLASTY, KNEE, PARTIAL, TO TOTAL KNEE ARTHROPLASTY
Anesthesia: Spinal | Site: Knee | Laterality: Right
